# Patient Record
Sex: Male | Born: 1945
Health system: Southern US, Community
[De-identification: ages and names within clinical notes are randomized; demographics above are authoritative.]

## PROBLEM LIST (undated history)

## (undated) DIAGNOSIS — E785 Hyperlipidemia, unspecified: Secondary | ICD-10-CM

## (undated) DIAGNOSIS — I739 Peripheral vascular disease, unspecified: Secondary | ICD-10-CM

## (undated) DIAGNOSIS — N189 Chronic kidney disease, unspecified: Secondary | ICD-10-CM

## (undated) DIAGNOSIS — C4490 Unspecified malignant neoplasm of skin, unspecified: Secondary | ICD-10-CM

## (undated) DIAGNOSIS — I10 Essential (primary) hypertension: Secondary | ICD-10-CM

## (undated) DIAGNOSIS — I350 Nonrheumatic aortic (valve) stenosis: Secondary | ICD-10-CM

## (undated) DIAGNOSIS — D500 Iron deficiency anemia secondary to blood loss (chronic): Secondary | ICD-10-CM

## (undated) HISTORY — DX: Peripheral vascular disease, unspecified: I73.9

## (undated) HISTORY — DX: Essential (primary) hypertension: I10

## (undated) HISTORY — DX: Nonrheumatic aortic (valve) stenosis: I35.0

## (undated) HISTORY — DX: Iron deficiency anemia secondary to blood loss (chronic): D50.0

## (undated) HISTORY — DX: Chronic kidney disease, unspecified: N18.9

## (undated) HISTORY — PX: OTHER SURGICAL HISTORY: SHX169

## (undated) HISTORY — DX: Hyperlipidemia, unspecified: E78.5

## (undated) HISTORY — DX: Hypercalcemia: E83.52

## (undated) HISTORY — PX: STENT PLACEMENT VASCULAR (ARMC HX): HXRAD1737

---

## 1999-10-10 HISTORY — PX: CORONARY ARTERY BYPASS GRAFT: SHX141

## 2017-10-11 ENCOUNTER — Telehealth: Payer: Self-pay

## 2017-10-11 ENCOUNTER — Other Ambulatory Visit: Payer: Self-pay

## 2017-10-11 ENCOUNTER — Ambulatory Visit (HOSPITAL_BASED_OUTPATIENT_CLINIC_OR_DEPARTMENT_OTHER): Payer: Medicare Other | Admitting: Hematology & Oncology

## 2017-10-11 VITALS — BP 113/66 | HR 81 | Temp 97.3°F | Resp 20 | Wt 121.5 lb

## 2017-10-11 DIAGNOSIS — F17210 Nicotine dependence, cigarettes, uncomplicated: Secondary | ICD-10-CM | POA: Diagnosis not present

## 2017-10-11 DIAGNOSIS — C44310 Basal cell carcinoma of skin of unspecified parts of face: Secondary | ICD-10-CM

## 2017-10-11 DIAGNOSIS — R634 Abnormal weight loss: Secondary | ICD-10-CM

## 2017-10-11 DIAGNOSIS — R630 Anorexia: Secondary | ICD-10-CM

## 2017-10-11 DIAGNOSIS — D500 Iron deficiency anemia secondary to blood loss (chronic): Secondary | ICD-10-CM

## 2017-10-11 MED ORDER — MEGESTROL ACETATE 625 MG/5ML PO SUSP: 625.0000 mg | Freq: Every day | ORAL | 3 refills | Status: DC

## 2017-10-11 NOTE — Telephone Encounter (Signed)
Notes sent to scheduling.   

## 2017-10-11 NOTE — Progress Notes (Signed)
Referral MD  Reason for Referral: Locally advanced basal cell carcinoma of the left face  Chief Complaint  Patient presents with  . New Patient (Initial Visit)  : I just moved here from [place].  HPI: Jeremy Arnold is a very nice 72 year old white male.  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  He also was in the [employer].  He served in [place].  He smokes.  He probably has a 50-pack-year history of tobacco use.  He says that he had a basal cell cancer taken off the pinna of his left ear.  This was several years ago.  It sounds like he also had a basal cell cancer on the left side of his nose.  He did not decline any therapy for this.  He continued to progress.  It began to erode into his skin and bones on the left side of his nose.  It also began to invade into the left orbit.  He cannot open his left eye.  His ex-wife, who is a member of my church, talk to me about him.  She and her daughter went out to [place] to bring him back to [place].  She was wonder if we might be able to see him to try to help him.  He is lost about 25 pounds.  His appetite is not that great because of this tumor.  He just does not want to eat that much.  He says he gets full pretty quickly.  Surprisingly, he says that the tumor on the left side of his face is not painful.  He says his left side of the face is numb.  He is able to swallow.  He does have dentures.  He has to see a Theatre stage manager.  He has had no fever.  He has had no bleeding.  He has had no actual headache.  He really has only seen a cardiologist out in [place].  He is on some medications but not a lot.  He does not really drink.  Overall, I would say his performance status is ECOG 2.   No past medical history on file.:  :   Current Outpatient Medications:  .  aspirin 81 MG chewable tablet, Chew 81 mg by mouth daily., Disp: , Rfl:  .  atorvastatin (LIPITOR) 40 MG tablet, Take 40 mg by mouth daily.,  Disp: , Rfl:  .  clopidogrel (PLAVIX) 75 MG tablet, Take 75 mg by mouth daily., Disp: , Rfl:  .  hydrochlorothiazide (MICROZIDE) 12.5 MG capsule, Take 12.5 mg by mouth daily., Disp: , Rfl:  .  Metoprolol Tartrate 75 MG TABS, Take 75 mg by mouth 2 (two) times daily., Disp: , Rfl:  .  ramipril (ALTACE) 10 MG capsule, Take 10 mg by mouth 2 (two) times daily., Disp: , Rfl: :  :  Allergies  Allergen Reactions  . Shellfish Allergy Swelling  :  No family history on file.:  Social History   Socioeconomic History  . Marital status: Divorced    Spouse name: Not on file  . Number of children: Not on file  . Years of education: Not on file  . Highest education level: Not on file  Social Needs  . Financial resource strain: Not on file  . Food insecurity - worry: Not on file  . Food insecurity - inability: Not on file  . Transportation needs - medical: Not on file  . Transportation needs - non-medical: Not on file  Occupational  History  . Not on file  Tobacco Use  . Smoking status: Not on file  Substance and Sexual Activity  . Alcohol use: Not on file  . Drug use: Not on file  . Sexual activity: Not on file  Other Topics Concern  . Not on file  Social History Narrative  . Not on file  :  Pertinent items are noted in HPI.  Exam: Thin white male in no obvious distress.  His vital signs show a temperature of 97.3.  Pulse is 81.  Blood pressure 113/66.  Weight is 121 pounds.  Head neck exam shows obvious facial deformity on the left side of his face.  The left side of his nose is eroded away by tumor.  He cannot open the left eye.  He has tumor invasion into the orbital region.  There is no intraoral lesions.  The right side of his face looks okay.  There is no adenopathy in the neck.  Thyroid is nonpalpable.  Lungs are clear bilaterally.  Cardiac exam regular rate and rhythm with no murmurs, rubs or bruits.  Abdomen is soft.  He has good bowel sounds.  There is no fluid wave.  There is no  abdominal mass.  There is no palpable liver or spleen tip.  Back exam shows no tenderness over the spine, ribs or hips.  Extremities shows some muscle atrophy in upper and lower extremities.  Has good range of motion of his joints.  Neurological exam shows no obvious neurological deficits.  Skin exam shows some isolated basal cell cancers on his skin.  He has some ecchymoses that are scattered. @IPVITALS @   No results for input(s): WBC, HGB, HCT, PLT in the last 72 hours. No results for input(s): NA, K, CL, CO2, GLUCOSE, BUN, CREATININE, CALCIUM in the last 72 hours.  Blood smear review: None  Pathology:  None    Assessment and Plan: Jeremy Arnold is a nice 72 year old white male.  He has locally advanced basal cell carcinoma of the left side of his face.  He absolutely has had no treatment for this.  I am absolutely amazed that he actually is doing as well as he is doing.  I really believe that we can help him out.  This definitely will take a effort that is from several specialist.  I think we have to get an MRI of his face so we can see the extent of his disease.  I am not sure there is any surgery that can be done for this problem.  I think that radiation therapy can definitely help him out.  We might be able to get a response with radiation therapy and then possibly try systemic therapy.  The new treatment option is a Designer, television/film set (Vismodegib).  I really believe that we can make a difference.  I do not think we will be able to cure this but we can certainly help control it and try to improve his quality of life.  He is willing to try treatment.  I gave him a prayer blanket.  He has a very strong faith.  We need to get labs on him.  I will see about having him come in tomorrow for labs.  We will get the MRI.  I will try to get this early next week.  I spoke with Dr. Sondra Come of radiation oncology.  He will see Jeremy Arnold next week.  I spent about an hour with Jeremy Arnold and his  ex-wife.  Again, I  know her from church.  I will probably plan to get him back to be seen in another couple weeks.  We will try some Megace to help with his appetite.

## 2017-10-12 ENCOUNTER — Telehealth: Payer: Self-pay | Admitting: Hematology & Oncology

## 2017-10-12 ENCOUNTER — Other Ambulatory Visit: Payer: Self-pay | Admitting: *Deleted

## 2017-10-12 ENCOUNTER — Ambulatory Visit (HOSPITAL_BASED_OUTPATIENT_CLINIC_OR_DEPARTMENT_OTHER): Payer: Medicare Other

## 2017-10-12 ENCOUNTER — Encounter: Payer: Self-pay | Admitting: Hematology & Oncology

## 2017-10-12 DIAGNOSIS — C44310 Basal cell carcinoma of skin of unspecified parts of face: Secondary | ICD-10-CM | POA: Diagnosis present

## 2017-10-12 DIAGNOSIS — D500 Iron deficiency anemia secondary to blood loss (chronic): Secondary | ICD-10-CM

## 2017-10-12 HISTORY — DX: Hypercalcemia: E83.52

## 2017-10-12 HISTORY — DX: Iron deficiency anemia secondary to blood loss (chronic): D50.0

## 2017-10-12 LAB — CBC WITH DIFFERENTIAL (CANCER CENTER ONLY)
BASO#: 0 10e3/uL (ref 0.0–0.2)
BASO%: 0.6 % (ref 0.0–2.0)
EOS%: 1.9 % (ref 0.0–7.0)
Eosinophils Absolute: 0.1 10e3/uL (ref 0.0–0.5)
HCT: 28.8 % — ABNORMAL LOW (ref 38.7–49.9)
HGB: 9.3 g/dL — ABNORMAL LOW (ref 13.0–17.1)
LYMPH#: 2.2 10e3/uL (ref 0.9–3.3)
LYMPH%: 32.8 % (ref 14.0–48.0)
MCH: 28.2 pg (ref 28.0–33.4)
MCHC: 32.3 g/dL (ref 32.0–35.9)
MCV: 87 fL (ref 82–98)
MONO#: 0.8 10e3/uL (ref 0.1–0.9)
MONO%: 11.8 % (ref 0.0–13.0)
NEUT#: 3.6 10e3/uL (ref 1.5–6.5)
NEUT%: 52.9 % (ref 40.0–80.0)
Platelets: 179 10e3/uL (ref 145–400)
RBC: 3.3 10e6/uL — ABNORMAL LOW (ref 4.20–5.70)
RDW: 13.8 % (ref 11.1–15.7)
WBC: 6.7 10e3/uL (ref 4.0–10.0)

## 2017-10-12 LAB — CMP (CANCER CENTER ONLY)
ALT(SGPT): 24 U/L (ref 10–47)
AST: 24 U/L (ref 11–38)
Albumin: 3.3 g/dL (ref 3.3–5.5)
Alkaline Phosphatase: 90 U/L — ABNORMAL HIGH (ref 26–84)
BUN, Bld: 22 mg/dL (ref 7–22)
CO2: 30 meq/L (ref 18–33)
Calcium: 10.6 mg/dL — ABNORMAL HIGH (ref 8.0–10.3)
Chloride: 96 meq/L — ABNORMAL LOW (ref 98–108)
Creat: 1.7 mg/dL — ABNORMAL HIGH (ref 0.6–1.2)
Glucose, Bld: 109 mg/dL (ref 73–118)
Potassium: 5.6 meq/L — ABNORMAL HIGH (ref 3.3–4.7)
Sodium: 138 meq/L (ref 128–145)
Total Bilirubin: 0.5 mg/dL (ref 0.20–1.60)
Total Protein: 7.5 g/dL (ref 6.4–8.1)

## 2017-10-12 MED ORDER — MEGESTROL ACETATE 625 MG/5ML PO SUSP: 625.0000 mg | Freq: Every day | ORAL | 3 refills | Status: DC

## 2017-10-12 MED FILL — MEGESTROL 625 MG/5 ML SUSP: 625 | 30 days supply | Qty: 150 | Fill #0

## 2017-10-12 NOTE — Addendum Note (Signed)
Addended by: Burney Gauze R on: 10/12/2017 05:52 PM   Modules accepted: Orders

## 2017-10-12 NOTE — Telephone Encounter (Signed)
Pharmacy Oral Part D Info:  [employer] P: [phone] ID: [id number]

## 2017-10-13 LAB — IRON AND TIBC CHCC
IRON SATURATION: 6 % — AB (ref 15–55)
Iron: 22 ug/dL — ABNORMAL LOW (ref 38–169)
Total Iron Binding Capacity: 374 ug/dL (ref 250–450)
UIBC: 352 ug/dL — ABNORMAL HIGH (ref 111–343)

## 2017-10-13 LAB — FERRITIN CHCC: FERRITIN: 12 ng/mL — AB (ref 30–400)

## 2017-10-15 ENCOUNTER — Other Ambulatory Visit: Payer: Self-pay

## 2017-10-15 ENCOUNTER — Inpatient Hospital Stay: Payer: Medicare Other | Attending: Hematology & Oncology

## 2017-10-15 VITALS — BP 99/53 | HR 70 | Temp 97.5°F | Resp 18

## 2017-10-15 DIAGNOSIS — F419 Anxiety disorder, unspecified: Secondary | ICD-10-CM | POA: Insufficient documentation

## 2017-10-15 DIAGNOSIS — Z7982 Long term (current) use of aspirin: Secondary | ICD-10-CM | POA: Diagnosis not present

## 2017-10-15 DIAGNOSIS — R682 Dry mouth, unspecified: Secondary | ICD-10-CM | POA: Insufficient documentation

## 2017-10-15 DIAGNOSIS — Z7902 Long term (current) use of antithrombotics/antiplatelets: Secondary | ICD-10-CM | POA: Diagnosis not present

## 2017-10-15 DIAGNOSIS — H5789 Other specified disorders of eye and adnexa: Secondary | ICD-10-CM | POA: Insufficient documentation

## 2017-10-15 DIAGNOSIS — E860 Dehydration: Secondary | ICD-10-CM | POA: Diagnosis not present

## 2017-10-15 DIAGNOSIS — C44310 Basal cell carcinoma of skin of unspecified parts of face: Secondary | ICD-10-CM | POA: Insufficient documentation

## 2017-10-15 DIAGNOSIS — D500 Iron deficiency anemia secondary to blood loss (chronic): Secondary | ICD-10-CM

## 2017-10-15 DIAGNOSIS — R52 Pain, unspecified: Secondary | ICD-10-CM | POA: Insufficient documentation

## 2017-10-15 MED ORDER — DENOSUMAB 120 MG/1.7ML ~~LOC~~ SOLN
SUBCUTANEOUS | Status: AC
  Filled 2017-10-15: qty 1.7

## 2017-10-15 MED ORDER — SODIUM CHLORIDE 0.9 % IV SOLN
750.0000 mg | Freq: Once | INTRAVENOUS | Status: AC
  Administered 2017-10-15: 750 mg via INTRAVENOUS
  Filled 2017-10-15: qty 15

## 2017-10-15 MED ORDER — DENOSUMAB 120 MG/1.7ML ~~LOC~~ SOLN
120.0000 mg | Freq: Once | SUBCUTANEOUS | Status: AC
Start: 2017-10-15 — End: 2017-10-15
  Administered 2017-10-15: 120 mg via SUBCUTANEOUS

## 2017-10-15 MED ORDER — SODIUM CHLORIDE 0.9 % IV SOLN
1000.0000 mL | INTRAVENOUS | Status: DC
  Administered 2017-10-15: 14:00:00 via INTRAVENOUS

## 2017-10-15 NOTE — Patient Instructions (Addendum)
Dehydration, Adult Dehydration is a condition in which there is not enough fluid or water in the body. This happens when you lose more fluids than you take in. Important organs, such as the kidneys, brain, and heart, cannot function without a proper amount of fluids. Any loss of fluids from the body can lead to dehydration. Dehydration can range from mild to severe. This condition should be treated right away to prevent it from becoming severe. What are the causes? This condition may be caused by:  Vomiting.  Diarrhea.  Excessive sweating, such as from heat exposure or exercise.  Not drinking enough fluid, especially: ? When ill. ? While doing activity that requires a lot of energy.  Excessive urination.  Fever.  Infection.  Certain medicines, such as medicines that cause the body to lose excess fluid (diuretics).  Inability to access safe drinking water.  Reduced physical ability to get adequate water and food.  What increases the risk? This condition is more likely to develop in people:  Who have a poorly controlled long-term (chronic) illness, such as diabetes, heart disease, or kidney disease.  Who are age 35 or older.  Who are disabled.  Who live in a place with high altitude.  Who play endurance sports.  What are the signs or symptoms? Symptoms of mild dehydration may include:  Thirst.  Dry lips.  Slightly dry mouth.  Dry, warm skin.  Dizziness. Symptoms of moderate dehydration may include:  Very dry mouth.  Muscle cramps.  Dark urine. Urine may be the color of tea.  Decreased urine production.  Decreased tear production.  Heartbeat that is irregular or faster than normal (palpitations).  Headache.  Light-headedness, especially when you stand up from a sitting position.  Fainting (syncope). Symptoms of severe dehydration may include:  Changes in skin, such as: ? Cold and clammy skin. ? Blotchy (mottled) or pale skin. ? Skin that does  not quickly return to normal after being lightly pinched and released (poor skin turgor).  Changes in body fluids, such as: ? Extreme thirst. ? No tear production. ? Inability to sweat when body temperature is high, such as in hot weather. ? Very little urine production.  Changes in vital signs, such as: ? Weak pulse. ? Pulse that is more than 100 beats a minute when sitting still. ? Rapid breathing. ? Low blood pressure.  Other changes, such as: ? Sunken eyes. ? Cold hands and feet. ? Confusion. ? Lack of energy (lethargy). ? Difficulty waking up from sleep. ? Short-term weight loss. ? Unconsciousness. How is this diagnosed? This condition is diagnosed based on your symptoms and a physical exam. Blood and urine tests may be done to help confirm the diagnosis. How is this treated? Treatment for this condition depends on the severity. Mild or moderate dehydration can often be treated at home. Treatment should be started right away. Do not wait until dehydration becomes severe. Severe dehydration is an emergency and it needs to be treated in a hospital. Treatment for mild dehydration may include:  Drinking more fluids.  Replacing salts and minerals in your blood (electrolytes) that you may have lost. Treatment for moderate dehydration may include:  Drinking an oral rehydration solution (ORS). This is a drink that helps you replace fluids and electrolytes (rehydrate). It can be found at pharmacies and retail stores. Treatment for severe dehydration may include:  Receiving fluids through an IV tube.  Receiving an electrolyte solution through a feeding tube that is passed through your nose  and into your stomach (nasogastric tube, or NG tube).  Correcting any abnormalities in electrolytes.  Treating the underlying cause of dehydration. Follow these instructions at home:  If directed by your health care provider, drink an ORS: ? Make an ORS by following instructions on the  package. ? Start by drinking small amounts, about  cup (120 mL) every 5-10 minutes. ? Slowly increase how much you drink until you have taken the amount recommended by your health care provider.  Drink enough clear fluid to keep your urine clear or pale yellow. If you were told to drink an ORS, finish the ORS first, then start slowly drinking other clear fluids. Drink fluids such as: ? Water. Do not drink only water. Doing that can lead to having too little salt (sodium) in the body (hyponatremia). ? Ice chips. ? Fruit juice that you have added water to (diluted fruit juice). ? Low-calorie sports drinks.  Avoid: ? Alcohol. ? Drinks that contain a lot of sugar. These include high-calorie sports drinks, fruit juice that is not diluted, and soda. ? Caffeine. ? Foods that are greasy or contain a lot of fat or sugar.  Take over-the-counter and prescription medicines only as told by your health care provider.  Do not take sodium tablets. This can lead to having too much sodium in the body (hypernatremia).  Eat foods that contain a healthy balance of electrolytes, such as bananas, oranges, potatoes, tomatoes, and spinach.  Keep all follow-up visits as told by your health care provider. This is important. Contact a health care provider if:  You have abdominal pain that: ? Gets worse. ? Stays in one area (localizes).  You have a rash.  You have a stiff neck.  You are more irritable than usual.  You are sleepier or more difficult to wake up than usual.  You feel weak or dizzy.  You feel very thirsty.  You have urinated only a small amount of very dark urine over 6-8 hours. Get help right away if:  You have symptoms of severe dehydration.  You cannot drink fluids without vomiting.  Your symptoms get worse with treatment.  You have a fever.  You have a severe headache.  You have vomiting or diarrhea that: ? Gets worse. ? Does not go away.  You have blood or green matter  (bile) in your vomit.  You have blood in your stool. This may cause stool to look black and tarry.  You have not urinated in 6-8 hours.  You faint.  Your heart rate while sitting still is over 100 beats a minute.  You have trouble breathing. This information is not intended to replace advice given to you by your health care provider. Make sure you discuss any questions you have with your health care provider. Document Released: 09/25/2005 Document Revised: 04/21/2016 Document Reviewed: 11/19/2015 Elsevier Interactive Patient Education  2018 [employer].  Denosumab injection Delton See) What is this medicine? DENOSUMAB (den oh sue mab) slows bone breakdown. Prolia is used to treat osteoporosis in women after menopause and in men. Delton See is used to treat a high calcium level due to cancer and to prevent bone fractures and other bone problems caused by multiple myeloma or cancer bone metastases. Delton See is also used to treat giant cell tumor of the bone. This medicine may be used for other purposes; ask your health care provider or pharmacist if you have questions. COMMON BRAND NAME(S): Prolia, XGEVA What should I tell my health care provider before I take  this medicine? They need to know if you have any of these conditions: -dental disease -having surgery or tooth extraction -infection -kidney disease -low levels of calcium or Vitamin D in the blood -malnutrition -on hemodialysis -skin conditions or sensitivity -thyroid or parathyroid disease -an unusual reaction to denosumab, other medicines, foods, dyes, or preservatives -pregnant or trying to get pregnant -breast-feeding How should I use this medicine? This medicine is for injection under the skin. It is given by a health care professional in a hospital or clinic setting. If you are getting Prolia, a special MedGuide will be given to you by the pharmacist with each prescription and refill. Be sure to read this information carefully  each time. For Prolia, talk to your pediatrician regarding the use of this medicine in children. Special care may be needed. For Delton See, talk to your pediatrician regarding the use of this medicine in children. While this drug may be prescribed for children as young as 13 years for selected conditions, precautions do apply. Overdosage: If you think you have taken too much of this medicine contact a poison control center or emergency room at once. NOTE: This medicine is only for you. Do not share this medicine with others. What if I miss a dose? It is important not to miss your dose. Call your doctor or health care professional if you are unable to keep an appointment. What may interact with this medicine? Do not take this medicine with any of the following medications: -other medicines containing denosumab This medicine may also interact with the following medications: -medicines that lower your chance of fighting infection -steroid medicines like prednisone or cortisone This list may not describe all possible interactions. Give your health care provider a list of all the medicines, herbs, non-prescription drugs, or dietary supplements you use. Also tell them if you smoke, drink alcohol, or use illegal drugs. Some items may interact with your medicine. What should I watch for while using this medicine? Visit your doctor or health care professional for regular checks on your progress. Your doctor or health care professional may order blood tests and other tests to see how you are doing. Call your doctor or health care professional for advice if you get a fever, chills or sore throat, or other symptoms of a cold or flu. Do not treat yourself. This drug may decrease your body's ability to fight infection. Try to avoid being around people who are sick. You should make sure you get enough calcium and vitamin D while you are taking this medicine, unless your doctor tells you not to. Discuss the foods you  eat and the vitamins you take with your health care professional. See your dentist regularly. Brush and floss your teeth as directed. Before you have any dental work done, tell your dentist you are receiving this medicine. Do not become pregnant while taking this medicine or for 5 months after stopping it. Talk with your doctor or health care professional about your birth control options while taking this medicine. Women should inform their doctor if they wish to become pregnant or think they might be pregnant. There is a potential for serious side effects to an unborn child. Talk to your health care professional or pharmacist for more information. What side effects may I notice from receiving this medicine? Side effects that you should report to your doctor or health care professional as soon as possible: -allergic reactions like skin rash, itching or hives, swelling of the face, lips, or tongue -bone pain -  breathing problems -dizziness -jaw pain, especially after dental work -redness, blistering, peeling of the skin -signs and symptoms of infection like fever or chills; cough; sore throat; pain or trouble passing urine -signs of low calcium like fast heartbeat, muscle cramps or muscle pain; pain, tingling, numbness in the hands or feet; seizures -unusual bleeding or bruising -unusually weak or tired Side effects that usually do not require medical attention (report to your doctor or health care professional if they continue or are bothersome): -constipation -diarrhea -headache -joint pain -loss of appetite -muscle pain -runny nose -tiredness -upset stomach This list may not describe all possible side effects. Call your doctor for medical advice about side effects. You may report side effects to FDA at 1-800-FDA-1088. Where should I keep my medicine? This medicine is only given in a clinic, doctor's office, or other health care setting and will not be stored at home. NOTE: This sheet is  a summary. It may not cover all possible information. If you have questions about this medicine, talk to your doctor, pharmacist, or health care provider.  2018 Elsevier/Gold Standard (2016-10-17 19:17:21)  Ferric carboxymaltose injection (Injectafer) What is this medicine? FERRIC CARBOXYMALTOSE (ferr-ik car-box-ee-mol-toes) is an iron complex. Iron is used to make healthy red blood cells, which carry oxygen and nutrients throughout the body. This medicine is used to treat anemia in people with chronic kidney disease or people who cannot take iron by mouth. This medicine may be used for other purposes; ask your health care provider or pharmacist if you have questions. COMMON BRAND NAME(S): Injectafer What should I tell my health care provider before I take this medicine? They need to know if you have any of these conditions: -anemia not caused by low iron levels -high levels of iron in the blood -liver disease -an unusual or allergic reaction to iron, other medicines, foods, dyes, or preservatives -pregnant or trying to get pregnant -breast-feeding How should I use this medicine? This medicine is for infusion into a vein. It is given by a health care professional in a hospital or clinic setting. Talk to your pediatrician regarding the use of this medicine in children. Special care may be needed. Overdosage: If you think you have taken too much of this medicine contact a poison control center or emergency room at once. NOTE: This medicine is only for you. Do not share this medicine with others. What if I miss a dose? It is important not to miss your dose. Call your doctor or health care professional if you are unable to keep an appointment. What may interact with this medicine? Do not take this medicine with any of the following medications: -deferoxamine -dimercaprol -other iron products This medicine may also interact with the following medications: -chloramphenicol -deferasirox This  list may not describe all possible interactions. Give your health care provider a list of all the medicines, herbs, non-prescription drugs, or dietary supplements you use. Also tell them if you smoke, drink alcohol, or use illegal drugs. Some items may interact with your medicine. What should I watch for while using this medicine? Visit your doctor or health care professional regularly. Tell your doctor if your symptoms do not start to get better or if they get worse. You may need blood work done while you are taking this medicine. You may need to follow a special diet. Talk to your doctor. Foods that contain iron include: whole grains/cereals, dried fruits, beans, or peas, leafy green vegetables, and organ meats (liver, kidney). What side effects may  I notice from receiving this medicine? Side effects that you should report to your doctor or health care professional as soon as possible: -allergic reactions like skin rash, itching or hives, swelling of the face, lips, or tongue -breathing problems -changes in blood pressure -feeling faint or lightheaded, falls -flushing, sweating, or hot feelings Side effects that usually do not require medical attention (report to your doctor or health care professional if they continue or are bothersome): -changes in taste -constipation -dizziness -headache -nausea -pain, redness, or irritation at site where injected -vomiting This list may not describe all possible side effects. Call your doctor for medical advice about side effects. You may report side effects to FDA at 1-800-FDA-1088. Where should I keep my medicine? This drug is given in a hospital or clinic and will not be stored at home. NOTE: This sheet is a summary. It may not cover all possible information. If you have questions about this medicine, talk to your doctor, pharmacist, or health care provider.  2018 Elsevier/Gold Standard (2015-10-28 11:20:47)

## 2017-10-15 NOTE — Progress Notes (Signed)
Ok to administer xgeva per Otilio Carpen.   Ok to discharge with low blood pressure per Laverna Peace, NP. Patient asymptomatic, patient denies fatigue, weakness or lightheadedness.

## 2017-10-17 ENCOUNTER — Ambulatory Visit: Payer: Medicare Other | Admitting: Hematology & Oncology

## 2017-10-18 ENCOUNTER — Ambulatory Visit
Admission: RE | Admit: 2017-10-18 | Discharge: 2017-10-18 | Disposition: A | Discharge status: Home / Self Care | Payer: Medicare Other | Source: Ambulatory Visit | Attending: Radiation Oncology | Admitting: Radiation Oncology

## 2017-10-18 ENCOUNTER — Ambulatory Visit: Payer: Medicare Other

## 2017-10-18 ENCOUNTER — Telehealth: Payer: Self-pay | Admitting: Oncology

## 2017-10-18 DIAGNOSIS — R220 Localized swelling, mass and lump, head: Secondary | ICD-10-CM | POA: Insufficient documentation

## 2017-10-18 DIAGNOSIS — R262 Difficulty in walking, not elsewhere classified: Secondary | ICD-10-CM | POA: Insufficient documentation

## 2017-10-18 DIAGNOSIS — F419 Anxiety disorder, unspecified: Secondary | ICD-10-CM | POA: Insufficient documentation

## 2017-10-18 DIAGNOSIS — R251 Tremor, unspecified: Secondary | ICD-10-CM | POA: Insufficient documentation

## 2017-10-18 DIAGNOSIS — F17210 Nicotine dependence, cigarettes, uncomplicated: Secondary | ICD-10-CM | POA: Insufficient documentation

## 2017-10-18 DIAGNOSIS — D500 Iron deficiency anemia secondary to blood loss (chronic): Secondary | ICD-10-CM | POA: Insufficient documentation

## 2017-10-18 DIAGNOSIS — Z7982 Long term (current) use of aspirin: Secondary | ICD-10-CM | POA: Insufficient documentation

## 2017-10-18 DIAGNOSIS — Z79899 Other long term (current) drug therapy: Secondary | ICD-10-CM | POA: Insufficient documentation

## 2017-10-18 DIAGNOSIS — H05402 Unspecified enophthalmos, left eye: Secondary | ICD-10-CM | POA: Insufficient documentation

## 2017-10-18 DIAGNOSIS — R531 Weakness: Secondary | ICD-10-CM | POA: Insufficient documentation

## 2017-10-18 DIAGNOSIS — Z85828 Personal history of other malignant neoplasm of skin: Secondary | ICD-10-CM | POA: Insufficient documentation

## 2017-10-18 DIAGNOSIS — M899 Disorder of bone, unspecified: Secondary | ICD-10-CM | POA: Insufficient documentation

## 2017-10-18 NOTE — Telephone Encounter (Signed)
Left message for patient regarding his appointment today.  Requested a return call.

## 2017-10-18 NOTE — Telephone Encounter (Signed)
Patient's emergency contact, Delcie Roch, called back and said they were not notified of the appointment today.  She would like to reschedule the appointment for the week of 1/21.  Appointment given for 1/23 at 9 am.  She verbalized agreement.

## 2017-10-19 ENCOUNTER — Ambulatory Visit: Payer: Medicare Other | Admitting: Family Medicine

## 2017-10-21 ENCOUNTER — Other Ambulatory Visit: Payer: Self-pay

## 2017-10-21 ENCOUNTER — Encounter (HOSPITAL_COMMUNITY): Payer: Self-pay | Admitting: Emergency Medicine

## 2017-10-21 ENCOUNTER — Emergency Department (HOSPITAL_COMMUNITY)
Admission: EM | Admit: 2017-10-21 | Discharge: 2017-10-22 | Disposition: A | Discharge status: Home / Self Care | Payer: Medicare Other | Attending: Emergency Medicine | Admitting: Emergency Medicine

## 2017-10-21 DIAGNOSIS — R040 Epistaxis: Secondary | ICD-10-CM | POA: Diagnosis not present

## 2017-10-21 DIAGNOSIS — Z7982 Long term (current) use of aspirin: Secondary | ICD-10-CM | POA: Insufficient documentation

## 2017-10-21 DIAGNOSIS — Z79899 Other long term (current) drug therapy: Secondary | ICD-10-CM | POA: Insufficient documentation

## 2017-10-21 HISTORY — DX: Unspecified malignant neoplasm of skin, unspecified: C44.90

## 2017-10-21 MED ORDER — TRANEXAMIC ACID 1000 MG/10ML IV SOLN
500.0000 mg | Freq: Once | INTRAVENOUS | Status: AC
  Administered 2017-10-22: 500 mg via TOPICAL
  Filled 2017-10-21: qty 10

## 2017-10-21 NOTE — ED Provider Notes (Signed)
[facility] DEPT Provider Note   CSN: [id number] Arrival date & time: 10/21/17  2211  Time seen 23:10 PM    History   Chief Complaint Chief Complaint  Patient presents with  . facial bleeding    HPI Jeremy Arnold is a 72 y.o. male.  HPI patient has basal cell carcinoma of the left side of his nose that has been untreated, it has invaded to the nasal septum and up into the left eye.  He was seen for the first time by Dr. Marin Olp on January 3.  Patient states he was anemic and he got a iron transfusion.  He also had hypercalcemia and was given IV fluids.  His blood pressure was noted to be low and they stopped his ramipril.  He states tonight about 30 minutes prior to arrival he blew his nose and he started having bleeding from the left side.  The bleeding lasted about 20 minutes.  It is not bleeding now.  They state it was not hemorrhaging however if he did not hold pressure on it the blood would trickle down his face.  Patient is on Plavix because he has stents in both legs proximally.  He also states he had bypass surgery in 2011, he does not have stents in his cardiac vessels.  He also takes a baby aspirin a day.  PCP  Oncology Volanda Napoleon, MD   Past Medical History:  Diagnosis Date  . Iron deficiency anemia due to chronic blood loss 10/12/2017  . 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 Skin cancer     Patient Active Problem List   Diagnosis Date Noted  . 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 Iron deficiency anemia due to chronic blood loss 10/12/2017    History reviewed. No pertinent surgical history.     Home Medications    Prior to Admission medications   Medication Sig Start Date End Date Taking? Authorizing Provider  aspirin 81 MG chewable tablet Chew 81 mg by mouth daily.   Yes [provider]  atorvastatin (LIPITOR) 40 MG tablet Take 40 mg by mouth daily.   Yes [provider]  clopidogrel  (PLAVIX) 75 MG tablet Take 75 mg by mouth daily.   Yes [provider]  megestrol (MEGACE ES) 625 MG/5ML suspension Take 5 mLs (625 mg total) by mouth daily. 10/12/17  Yes Volanda Napoleon, MD  metoprolol tartrate (LOPRESSOR) 25 MG tablet Take 25 mg by mouth 2 (two) times daily. Take with 50mg  tablet for a total dose of 75mg  twice a day   Yes [provider]  metoprolol tartrate (LOPRESSOR) 50 MG tablet Take 50 mg by mouth 2 (two) times daily. Take with 25mg  tablet for a total dose of 75mg  twice a day   Yes [provider]  Multiple Vitamin (MULTIVITAMIN WITH MINERALS) TABS tablet Take 1 tablet by mouth daily.   Yes [provider]    Family History History reviewed. No pertinent family history.  Social History Social History   Tobacco Use  . Smoking status: Never Smoker  . Smokeless tobacco: Never Used  Substance Use Topics  . Alcohol use: No    Frequency: Never  . Drug use: No  recently moved from [place] allergy   Review of Systems Review of Systems  All other systems reviewed and are negative.    Physical Exam Updated Vital Signs BP 128/60 (BP Location: Left Arm)   Pulse 90  Temp 98.3 F (36.8 C) (Oral)   Resp 16   SpO2 100%   Vital signs normal    Physical Exam  Constitutional: He is oriented to person, place, and time.  Thin pleasant cooperative male  HENT:  Head: Normocephalic.  Right Ear: External ear normal.  Left Ear: External ear normal.  Pt has extensive loss of soft tissue of his left nose, the nasal septum is exposed and visible with some dried blood that seems to start high up on the septum. Most of the left nostril is gone. No gross bleeding now. He has some excess soft tissue masses between the nose and the left eye c/w tumor involvement.   Eyes:  Unable to see his left globe but he appears to have tissue loss behind his eyelids compared to the right.   Neck: Normal range of motion.  Neck supple.  Cardiovascular: Normal rate.  Pulmonary/Chest: Effort normal. No respiratory distress.  Neurological: He is alert and oriented to person, place, and time.  Skin: Skin is warm and dry.  Psychiatric: He has a normal mood and affect. His behavior is normal. Thought content normal.  Nursing note and vitals reviewed.    ED Treatments / Results  Labs (all labs ordered are listed, but only abnormal results are displayed) Labs Reviewed - No data to display  EKG  EKG Interpretation None       Radiology No results found.  Procedures Procedures (including critical care time)  Medications Ordered in ED Medications  tranexamic acid (CYKLOKAPRON) injection 500 mg (500 mg Topical Given by Other 10/22/17 0016)     Initial Impression / Assessment and Plan / ED Course  I have reviewed the triage vital signs and the nursing notes.  Pertinent labs & imaging results that were available during my care of the patient were reviewed by me and considered in my medical decision making (see chart for details).     Patient does not want blood work to be done today.  I did review his blood work from the fourth and he was noted to be anemic with a hemoglobin around 9 and hypercalcemic with calcium around 10.5.  His iron indices were low which explains why they gave him an iron transfusion.  He also was started on Megace for loss of appetite with a 40 pound weight loss per family.  Surgicel was moistened with TXA to keep in on the nasal septum for control of bleeding.   Final Clinical Impressions(s) / ED Diagnoses   Final diagnoses:  Bleeding nose    ED Discharge Orders    None      Plan discharge  Rolland Porter, MD, Barbette Or, MD 10/22/17 [phone]

## 2017-10-21 NOTE — ED Triage Notes (Signed)
Pt has skin cancer on his face and about 45 minutes ago he went to blow his nose and the left side of his nose started bleeding   Pt is on blood thinners and he is on a baby aspirin regimen  Pt sees Dr Levin Bacon at the [facility]

## 2017-10-22 ENCOUNTER — Other Ambulatory Visit: Payer: Self-pay | Admitting: Family

## 2017-10-22 ENCOUNTER — Ambulatory Visit
Admission: RE | Admit: 2017-10-22 | Discharge: 2017-10-22 | Disposition: A | Discharge status: Home / Self Care | Payer: Medicare Other | Source: Ambulatory Visit | Attending: Hematology & Oncology | Admitting: Hematology & Oncology

## 2017-10-22 DIAGNOSIS — C44310 Basal cell carcinoma of skin of unspecified parts of face: Secondary | ICD-10-CM

## 2017-10-22 DIAGNOSIS — D500 Iron deficiency anemia secondary to blood loss (chronic): Secondary | ICD-10-CM

## 2017-10-22 DIAGNOSIS — R040 Epistaxis: Secondary | ICD-10-CM | POA: Diagnosis not present

## 2017-10-22 IMAGING — MR MR FACE/TRIGEMINAL WO/W CM
6 of 10 series · 20 of 48 positions shown · IV contrast (10ml multihance)
Comparison: None.

CLINICAL DATA: 71-year-old male with basal cell skin cancer, with
suspected bone involvement. Study for treatment planning.

Patient reports visual problems, difficulty walking, and bilateral
weakness.
EXAM:
MRI FACE TRIGEMINAL WITHOUT AND WITH CONTRAST
TECHNIQUE: Multiplanar, multiecho pulse sequences of the face and surrounding
structures, including thin slice imaging of the course of the
Trigeminal Nerves, were obtained both before and after
administration of intravenous contrast.
CONTRAST:  10mL MULTIHANCE GADOBENATE DIMEGLUMINE 529 MG/ML IV SOLN

[Series 2: T1 · sagittal · 3.0mm · 0.35mm/px · 1 of 14 slices shown (1 of 4)]
[im 1/14]
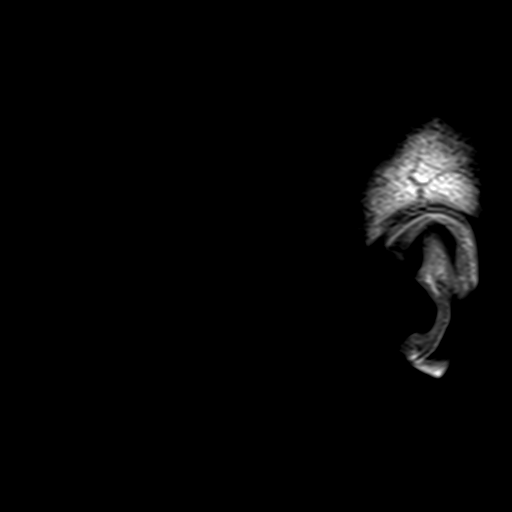

[Series 3: T1 · coronal · 3.0mm · 0.35mm/px · 3 of 45 slices shown (2 of 4)]
[im 1/45]
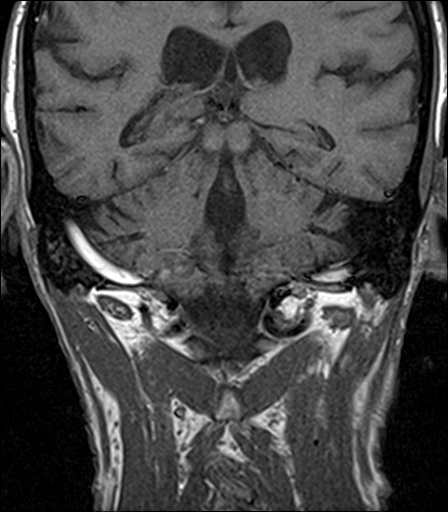
[im 23/45]
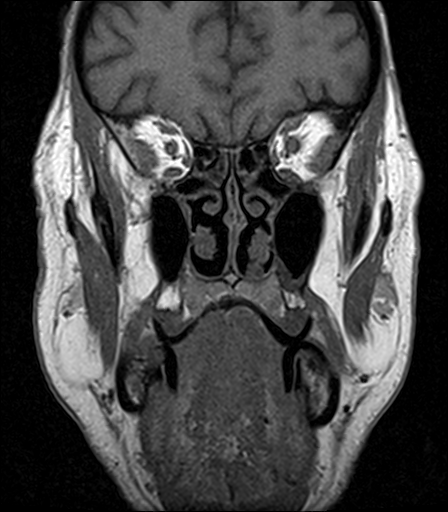
[im 45/45]
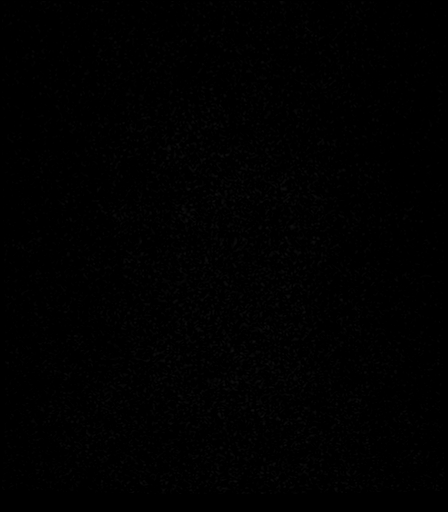

[Series 4: T1 · sagittal · 3.0mm · 0.35mm/px · 4 of 39 slices shown (3 of 4)]
[im 1/39]
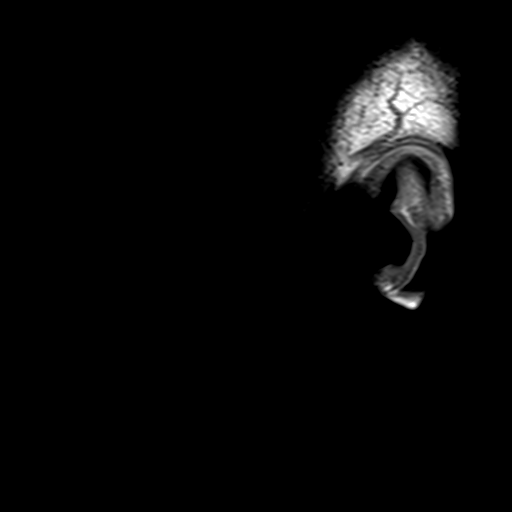
[im 13/39]
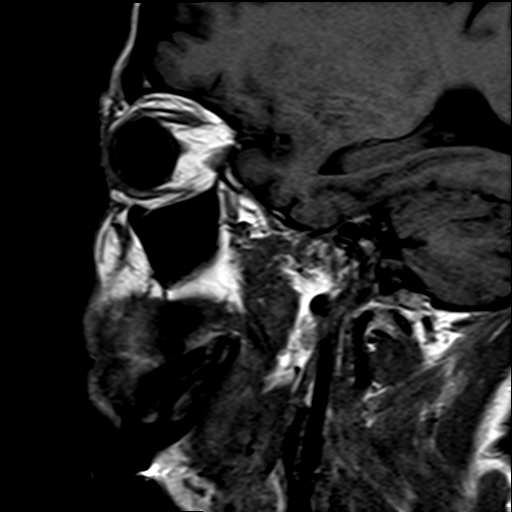
[im 26/39]
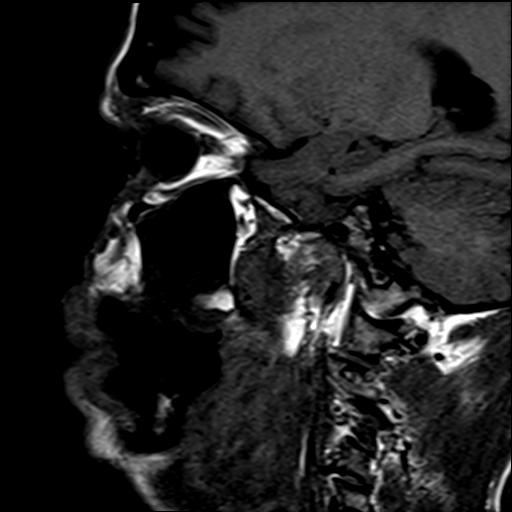
[im 39/39]
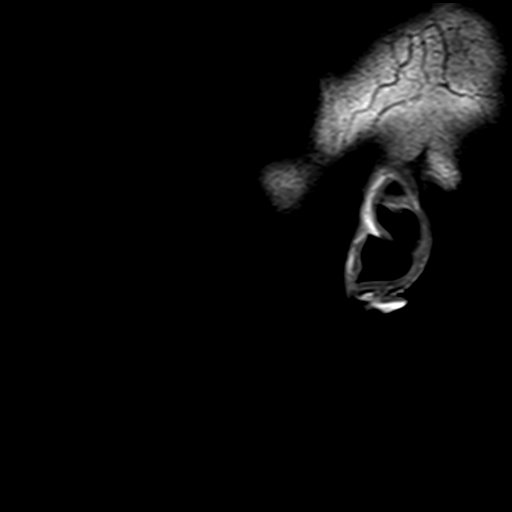

[Series 5: T2 · coronal · 3.0mm · 0.35mm/px · 5 of 48 slices shown]
[im 1/48]
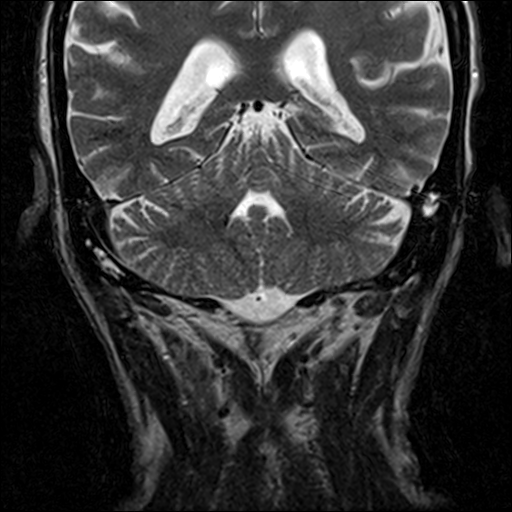
[im 12/48]
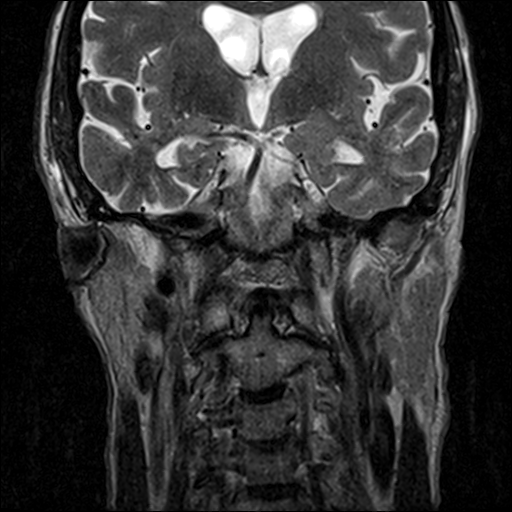
[im 24/48]
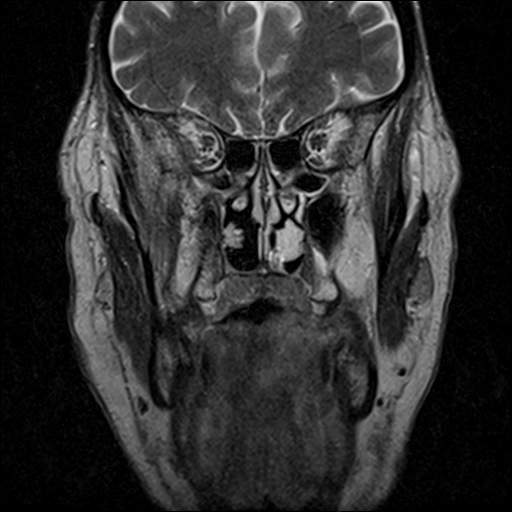
[im 36/48]
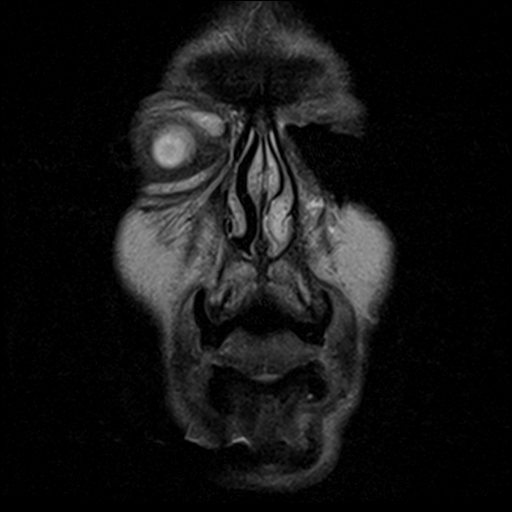
[im 48/48]
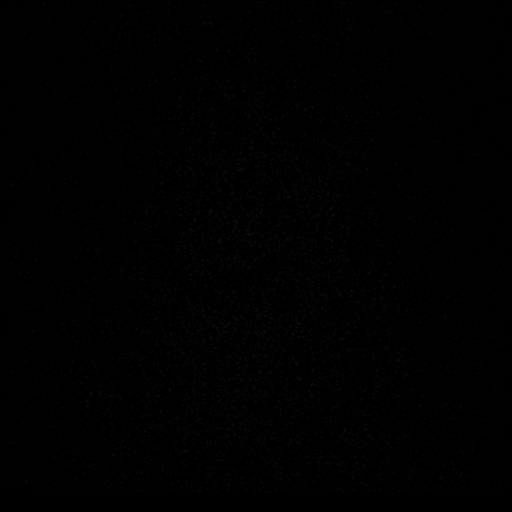

[Series 7: T1 · axial · 3.0mm · 0.50mm/px · z∈[-160,+18]mm · 5 of 55 slices shown (4 of 4)]
[im 1/55]
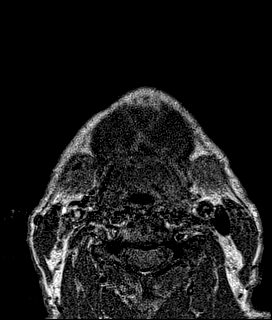
[im 14/55]
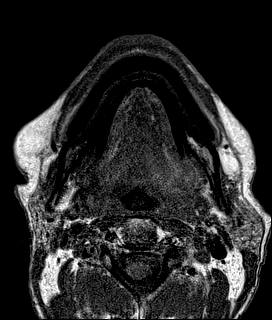
[im 28/55]
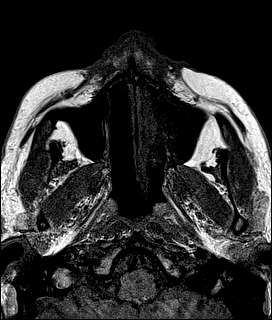
[im 41/55]
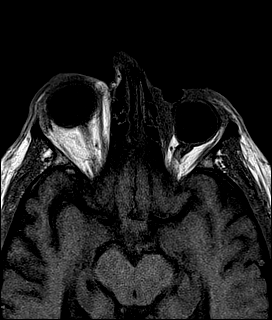
[im 55/55]
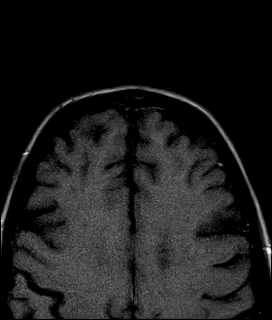

[Series 10: T1 fat-sat · coronal · 3.0mm · 0.35mm/px · 2 of 48 slices shown]
[im 1/48]
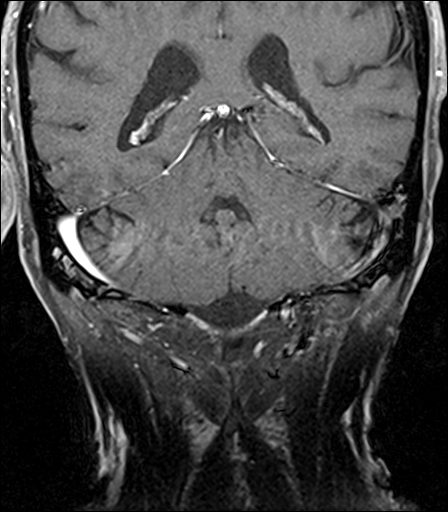
[im 12/48]
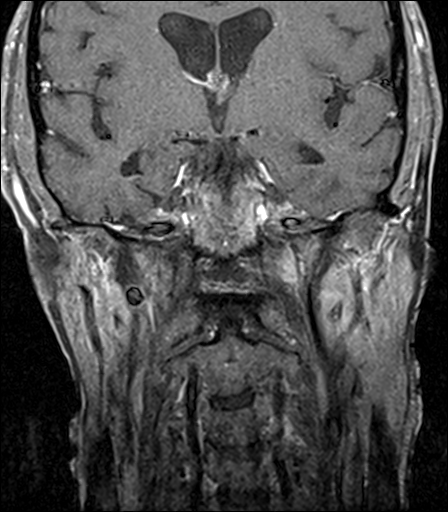

[20 of 48 positions shown; findings below may reference images not displayed]

FINDINGS: Major intracranial vascular flow voids are preserved. The brainstem
and visible brain parenchyma appears within normal limits for age.
No dural thickening along the left anterior cranial fossa superior
to the abnormal left orbit (see below).

Normal bilateral cisternal 5th nerve segments. Normal bilateral
Meckel's cave. Bilateral cavernous sinus is within normal limits.

The bilateral V2 trunks appear symmetric and within normal limits
(series 5, image 19 and series 10, image 20). The bilateral V3
trunks just caudal to the skull base appear symmetric and within
normal limits (series 11, image 19). Likewise, the bilateral
infraorbital and bilateral inferior alveolar nerves appear symmetric
and within normal limits.

Postoperative changes to the right globe. The right orbit soft
tissues appear normal.

However, there is left enophthalmos, distortion of the left globe
contour, and multifocal enhancing soft tissue masses within the left
orbit.

Along the anterior portion of the left globe a confluent circular or
comma-shaped soft tissue mass is demonstrated (series 3, image 32)
up to 3.4 centimeters diameter/CC. Note the contiguous
hyperenhancement (series 10, image 34) which is contiguous with the
skin surface, and with what appears to be surgical defect along the
left preseptal space (series 6, image 35). There is associated
distortion of the left globe shaped, as well as asymmetric
thickening and enhancement of the left retina and/or choroid. See
series 11, images 29 and 30. The left lens appears spared.

The soft tissue mass then tracks posteriorly into the postseptal
compartment of the left orbit, with contiguous components in both
the medial and lateral left orbit. Medially a confluent mass is
inseparable from the superior rectus, medial rectus, and left
superior oblique muscles (series 3, image 27). Laterally both the
intraconal and extraconal space are affected with mass inseparable
from the lateral rectus muscle (series 3, image 28). There is also
more curvilinear, less mass like enhancement along the margins of
the left optic nerve posterior to the left globe as seen on series
11, image 30. However, no abnormal enhancement within the substance
of the left optic nerve. The left orbital apex appears normal.

There also a masslike soft tissue thickening affecting the residual
superficial skin and dermis of the anterior nose (more so on the
right as a portion of the left nose has been resected) and tracking
toward the top lip. There is associated involvement of the medial
left premalar soft tissues encompassing an area of 2-2.5 centimeters
largest dimension as seen on series 11, image 20. There could be
involvement of the anterior cartilaginous nasal septum.

Ethmoid and left maxillary sinus mucosal thickening, as well as
mucosal thickening and enhancement in the left nasal cavity with
retained secretions appears to be benign and inflammatory. However,
focal extension across the left lamina papyracea and into some of
the left ethmoids is difficult to exclude (e.g. Series 10, image
30). The right side sinuses are clear.

Negative visible pharynx, parapharyngeal spaces, retropharyngeal
space, sublingual space, submandibular spaces, and parotid spaces.
No upper cervical lymphadenopathy identified.

No definite abnormal bone marrow signal in the left face or orbit.
Visible skull base and cervical spine marrow signal is within normal
limits. Negative visible spinal cord.
IMPRESSION: 1. Confluent and multifocal soft tissue masses in both the preseptal
and postseptal spaces of the left orbit with associated
enophthalmos, involvement of multiple anterior extraocular muscles,
and involvement and deformity of the left globe. Patchy involvement
along the margins of the anterior left optic nerve, but no tumor is
suspected within the substance of the left optic nerve.
2. No evidence of extension to the left orbital apex or into the
cavernous sinus. No bilateral V2, V3, or main trigeminal nerve
involvement. No other intracranial extension; no associated dural
thickening or intracranial extension along the floor of the left
anterior cranial fossa.
3. Tumor along the residual bilateral nose and a 2 cm area of
involvement at the medial left premalar soft tissues adjacent to
prior left nasal resection site. Tumor extension into the anterior
cartilaginous nasal septum and across the left lamina papyracea into
some of the left ethmoid air cells cannot be excluded.

## 2017-10-22 MED ORDER — GADOBENATE DIMEGLUMINE 529 MG/ML IV SOLN
10.0000 mL | Freq: Once | INTRAVENOUS | Status: AC | PRN
  Administered 2017-10-22: 10 mL via INTRAVENOUS

## 2017-10-22 NOTE — Discharge Instructions (Signed)
Try to keep the surgicel (cloth like material) over the bleeding area. Return to the ED if you have heavy bleeding. Otherwise, keep your appointments as arranged by Dr Marin Olp.

## 2017-10-23 ENCOUNTER — Other Ambulatory Visit: Payer: Self-pay

## 2017-10-23 ENCOUNTER — Inpatient Hospital Stay: Payer: Medicare Other

## 2017-10-23 ENCOUNTER — Inpatient Hospital Stay (HOSPITAL_BASED_OUTPATIENT_CLINIC_OR_DEPARTMENT_OTHER): Payer: Medicare Other | Admitting: Family

## 2017-10-23 VITALS — BP 120/60 | HR 84 | Temp 98.1°F | Resp 20 | Wt 129.5 lb

## 2017-10-23 DIAGNOSIS — C44310 Basal cell carcinoma of skin of unspecified parts of face: Secondary | ICD-10-CM | POA: Diagnosis not present

## 2017-10-23 DIAGNOSIS — D500 Iron deficiency anemia secondary to blood loss (chronic): Secondary | ICD-10-CM

## 2017-10-23 DIAGNOSIS — R682 Dry mouth, unspecified: Secondary | ICD-10-CM

## 2017-10-23 DIAGNOSIS — F418 Other specified anxiety disorders: Secondary | ICD-10-CM

## 2017-10-23 DIAGNOSIS — G4700 Insomnia, unspecified: Secondary | ICD-10-CM | POA: Diagnosis not present

## 2017-10-23 LAB — IRON AND TIBC
Iron: 70 ug/dL (ref 42–163)
SATURATION RATIOS: 24 % — AB (ref 42–163)
TIBC: 291 ug/dL (ref 202–409)
UIBC: 222 ug/dL

## 2017-10-23 LAB — CMP (CANCER CENTER ONLY)
ALBUMIN: 3.1 g/dL — AB (ref 3.5–5.0)
ALT: 22 U/L (ref 0–55)
AST: 20 U/L (ref 5–34)
Alkaline Phosphatase: 66 U/L (ref 40–150)
Anion gap: 7 (ref 3–11)
BUN: 11 mg/dL (ref 7–26)
CHLORIDE: 104 mmol/L (ref 98–109)
CO2: 21 mmol/L — ABNORMAL LOW (ref 22–29)
CREATININE: 1.29 mg/dL (ref 0.70–1.30)
Calcium: 7.8 mg/dL — ABNORMAL LOW (ref 8.4–10.4)
GFR, Est AFR Am: >60 mL/min (ref >60)
GFR, Estimated: 54 mL/min — ABNORMAL LOW (ref >60)
Glucose, Bld: 87 mg/dL (ref 70–140)
POTASSIUM: 4.7 mmol/L (ref 3.5–5.1)
Sodium: 132 mmol/L — ABNORMAL LOW (ref 136–145)
Total Bilirubin: 0.3 mg/dL (ref 0.2–1.2)
Total Protein: 6.4 g/dL (ref 6.4–8.3)

## 2017-10-23 LAB — CBC WITH DIFFERENTIAL (CANCER CENTER ONLY)
BASOS ABS: 0 10*3/uL (ref 0.0–0.1)
Basophils Relative: 0 %
EOS ABS: 0.1 10*3/uL (ref 0.0–0.5)
EOS PCT: 2 %
HCT: 26.9 % — ABNORMAL LOW (ref 38.7–49.9)
Hemoglobin: 8.5 g/dL — ABNORMAL LOW (ref 13.0–17.1)
LYMPHS PCT: 23 %
Lymphs Abs: 2.1 10*3/uL (ref 0.9–3.3)
MCH: 28.8 pg (ref 28.0–33.4)
MCHC: 31.6 g/dL — ABNORMAL LOW (ref 32.0–35.9)
MCV: 91.2 fL (ref 82.0–98.0)
MONO ABS: 0.9 10*3/uL (ref 0.1–0.9)
Monocytes Relative: 10 %
Neutro Abs: 5.8 10*3/uL (ref 1.5–6.5)
Neutrophils Relative %: 65 %
PLATELETS: 220 10*3/uL (ref 140–400)
RBC: 2.95 MIL/uL — AB (ref 4.20–5.70)
RDW: 17.4 % — AB (ref 11.1–15.7)
WBC: 8.9 10*3/uL (ref 4.0–10.3)

## 2017-10-23 LAB — FERRITIN: Ferritin: 583 ng/mL — ABNORMAL HIGH (ref 22–316)

## 2017-10-23 MED ORDER — LORAZEPAM 0.5 MG PO TABS: 0.5000 mg | ORAL_TABLET | Freq: Three times a day (TID) | ORAL | 0 refills | Status: DC | PRN

## 2017-10-23 NOTE — Progress Notes (Signed)
Hematology and Oncology Follow Up Visit  Jeremy Arnold [id number] Mar 11, 1946 73 y.o. 10/23/2017   Principle Diagnosis:  Locally advanced basal cell carcinoma of the left face  Current Therapy:   Referral placed to Radiation Oncology    Interim History:  Jeremy Arnold is here today with his ex-wife and daughter for follow-up after his ED visit on 1/13 for bleeding from the left side of his nose. He currently has an anticoagulant dressing on the left side of his nose at the site.  No other bleeding. He does bruise easily on Plavix.  MRI of the face reflected locally advanced basal cell carcinoma of the left face. He will see Dr. Sondra Come later this week for further workup for radiation.  He isn't sleeping well and is having fatigue. He states that he is waking up ever 2-3 hours with a dry mouth.  No fever, chills, n/v, cough, rash, dizziness, SOB, chest pain, palpitations, abdominal pain or changes in bowel or bladder habits.  No swelling, tenderness, numbness or tingling in his extremities. No c/o pain.  He has maintained a good appetite and is staying well hydrated. He has done well with Megace, his weight is up 8 lbs since his last visit.    ECOG Performance Status: 1 - Symptomatic but completely ambulatory  Medications:  Allergies as of 10/23/2017      Reactions   Shellfish Allergy Nausea And Vomiting, Other (See Comments)   Stomach cramp      Medication List        Accurate as of 10/23/17 10:48 AM. Always use your most recent med list.          aspirin 81 MG chewable tablet Chew 81 mg by mouth daily.   atorvastatin 40 MG tablet Commonly known as:  LIPITOR Take 40 mg by mouth daily.   clopidogrel 75 MG tablet Commonly known as:  PLAVIX Take 75 mg by mouth daily.   megestrol 625 MG/5ML suspension Commonly known as:  MEGACE ES Take 5 mLs (625 mg total) by mouth daily.   metoprolol tartrate 25 MG tablet Commonly known as:  LOPRESSOR Take 25 mg by mouth daily. Take with  50mg  tablet for a total dose of 75mg  twice a day   metoprolol tartrate 50 MG tablet Commonly known as:  LOPRESSOR Take 50 mg by mouth daily. Take with 25mg  tablet for a total dose of 75mg  twice a day   multivitamin with minerals Tabs tablet Take 1 tablet by mouth daily.       Allergies:  Allergies  Allergen Reactions  . Shellfish Allergy Nausea And Vomiting and Other (See Comments)    Stomach cramp    Past Medical History, Surgical history, Social history, and Family History were reviewed and updated.  Review of Systems: All other 10 point review of systems is negative.   Physical Exam:  weight is 129 lb 8 oz (58.7 kg). His oral temperature is 98.1 F (36.7 C). His blood pressure is 120/60 and his pulse is 84. His respiration is 20 and oxygen saturation is 100%.   Wt Readings from Last 3 Encounters:  10/23/17 129 lb 8 oz (58.7 kg)  10/11/17 121 lb 8 oz (55.1 kg)    Ocular: Sclerae unicteric, pupils equal, round and reactive to light Ear-nose-throat: Oropharynx clear, dentition fair Lymphatic: No cervical, supraclavicular or axillary adenopathy Lungs no rales or rhonchi, good excursion bilaterally Heart regular rate and rhythm, no murmur appreciated Abd soft, nontender, positive bowel sounds, no liver or spleen  tip palpated on exam, no fluid wave  MSK no focal spinal tenderness, no joint edema Neuro: non-focal, well-oriented, appropriate affect Breasts: Deferred    Lab Results  Component Value Date   WBC 6.7 10/12/2017   HGB 9.3 (L) 10/12/2017   HCT 28.8 (L) 10/12/2017   MCV 87 10/12/2017   PLT 179 10/12/2017   Lab Results  Component Value Date   FERRITIN 12 (L) 10/12/2017   IRON 22 (L) 10/12/2017   TIBC 374 10/12/2017   UIBC 352 (H) 10/12/2017   IRONPCTSAT 6 (L) 10/12/2017   Lab Results  Component Value Date   RBC 3.30 (L) 10/12/2017   No results found for: KPAFRELGTCHN, LAMBDASER, KAPLAMBRATIO No results found for: IGGSERUM, IGA, IGMSERUM No results  found for: TOTALPROTELP, ALBUMINELP, A1GS, A2GS, Violet Baldy, MSPIKE, SPEI   Chemistry      Component Value Date/Time   NA 138 10/12/2017 1425   K 5.6 (H) 10/12/2017 1425   CL 96 (L) 10/12/2017 1425   CO2 30 10/12/2017 1425   BUN 22 10/12/2017 1425   CREATININE 1.7 (H) 10/12/2017 1425      Component Value Date/Time   CALCIUM 10.6 (H) 10/12/2017 1425   ALKPHOS 90 (H) 10/12/2017 1425   AST 24 10/12/2017 1425   ALT 24 10/12/2017 1425   BILITOT 0.50 10/12/2017 1425      Impression and Plan: Jeremy Arnold is a very pleasant 72 yo caucasian gentleman with locally advanced basal cell carcinoma of the left face. He had a bleed from the left side of the nose on 1/13 and was treated in the ED.  Dr. Marin Olp was able to speak with Dr. Sondra Come and he will try and get the patient in this week.  We will having him try rinsing with water and baking soda and also the ACT dry mouth lozenges.  I sent in a prescription for Ativan 0.5 mg q 8 hours PRN for anxiety and also for sleep.  We will plan to see him back next week on the 24th.  He will contact our office with any questions or concerns. We can certainly see him sooner if need be.   Laverna Peace, NP 1/15/201910:48 AM

## 2017-10-31 ENCOUNTER — Other Ambulatory Visit: Payer: Self-pay | Admitting: *Deleted

## 2017-10-31 ENCOUNTER — Other Ambulatory Visit: Payer: Self-pay

## 2017-10-31 ENCOUNTER — Ambulatory Visit
Admission: RE | Admit: 2017-10-31 | Discharge: 2017-10-31 | Disposition: A | Discharge status: Home / Self Care | Payer: Medicare Other | Source: Ambulatory Visit | Attending: Radiation Oncology | Admitting: Radiation Oncology

## 2017-10-31 ENCOUNTER — Encounter: Payer: Self-pay | Admitting: Radiation Oncology

## 2017-10-31 ENCOUNTER — Encounter: Payer: Self-pay | Admitting: General Practice

## 2017-10-31 DIAGNOSIS — C44310 Basal cell carcinoma of skin of unspecified parts of face: Secondary | ICD-10-CM

## 2017-10-31 DIAGNOSIS — G4700 Insomnia, unspecified: Secondary | ICD-10-CM

## 2017-10-31 DIAGNOSIS — F418 Other specified anxiety disorders: Secondary | ICD-10-CM

## 2017-10-31 MED ORDER — LORAZEPAM 1 MG PO TABS: 1.0000 mg | ORAL_TABLET | Freq: Three times a day (TID) | ORAL | 0 refills | Status: DC

## 2017-10-31 NOTE — Progress Notes (Signed)
Radiation Oncology         [phone] ________________________________  Initial Outpatient Consultation  Name: Jeremy Arnold MRN: [id number]  Date: 10/31/2017  DOB: 1945/12/23  CC:Jeremy Napoleon, MD  Jeremy Napoleon, MD   REFERRING PHYSICIAN: Volanda Napoleon, MD  DIAGNOSIS: Presumptive advanced basal cell carcinoma of the left face  HISTORY OF PRESENT ILLNESS::Jeremy Arnold is a 72 y.o. male who is presenting today for further discussion regarding his presumed basal cell carcinoma involving his left sided face. He is accompanied by his ex-wife. He notes that he initially presented with a red area to the right bridge of his nose approximately 10 years ago. He reports that he had prior hx of basal cell carcinoma of his left year and his left sided nose. He was evaluated by Oncologist, Dr. Marin Olp on 10/11/17 for a probable basal cell carcinoma to the left side of his face. He hadn't undergone treatment at that time. Dr. Marin Olp referred the patient for further evaluation and radiation treatment. He is taking 0.5 mg Ativan (two at a time) to aid with his increased anxiety that was prescribed by Dr. Marin Olp. He denies taking any pain medication on a regular basis. He reports that he is retired and that he lives at home with his ex-wife and his older daughter. He was living in [place] up until recently   He was evaluated by the ED following a nasal bleeding on 10/21/17. He then had Surgicel moistened with TXA placed on his nose with relief of his symptoms. He reports that he is on Plavix and 81 mg ASA. He is wondering if he could have the gauze removed today. He is able to see light and dark but unable to see out of his left eye. He has had prior cataract surgery to his right eye.   He recently underwent MR Face/trigeminal on 10/22/17 with results showing: IMPRESSION: 1. Confluent and multifocal soft tissue masses in both the preseptal and postseptal spaces of the left orbit with associated  enophthalmos, involvement of multiple anterior extraocular muscles, and involvement and deformity of the left globe. Patchy involvement along the margins of the anterior left optic nerve, but no tumor is suspected within the substance of the left optic nerve. 2. No evidence of extension to the left orbital apex or into the cavernous sinus. No bilateral V2, V3, or main trigeminal nerve involvement. No other intracranial extension; no associated dural thickening or intracranial extension along the floor of the left anterior cranial fossa. 3. Tumor along the residual bilateral nose and a 2 cm area of involvement at the medial left premalar soft tissues adjacent to prior left nasal resection site. Tumor extension into the anterior cartilaginous nasal septum and across the left lamina papyracea into some of the left ethmoid air cells cannot be excluded.  On review of systems, he reports bilateral hand tremors and nasal congestion. He denies hearing loss to either ear and any other symptoms. He denies prior surgery in the head or neck area except involving his left ear.   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    PAST SURGICAL HISTORY: Past Surgical History:  Procedure Laterality Date  . CORONARY ARTERY BYPASS GRAFT  2001   x 3 vessel  . left ear     basal skin cancer removal  . STENT PLACEMENT VASCULAR ([place] HX)  bilateral groin    FAMILY HISTORY: family history is not on file.  SOCIAL HISTORY:  reports that he has been smoking.  He has a 50.00 pack-year smoking history. he has never used smokeless tobacco. He reports that he does not drink alcohol or use drugs.  ALLERGIES: Shellfish allergy  MEDICATIONS:  Current Outpatient Medications  Medication Sig Dispense Refill  . aspirin 81 MG chewable tablet Chew 81 mg by mouth  daily.    Marland Kitchen atorvastatin (LIPITOR) 40 MG tablet Take 40 mg by mouth daily.    . clopidogrel (PLAVIX) 75 MG tablet Take 75 mg by mouth daily.    . megestrol (MEGACE ES) 625 MG/5ML suspension Take 5 mLs (625 mg total) by mouth daily. 150 mL 3  . metoprolol tartrate (LOPRESSOR) 25 MG tablet Take 25 mg by mouth daily. Take with 50mg  tablet for a total dose of 75mg  twice a day    . metoprolol tartrate (LOPRESSOR) 50 MG tablet Take 50 mg by mouth daily. Take with 25mg  tablet for a total dose of 75mg  twice a day    . Multiple Vitamin (MULTIVITAMIN WITH MINERALS) TABS tablet Take 1 tablet by mouth daily.    Marland Kitchen LORazepam (ATIVAN) 1 MG tablet Take 1 tablet (1 mg total) by mouth every 8 (eight) hours. For anxiety or sleep 30 tablet 0   No current facility-administered medications for this encounter.     REVIEW OF SYSTEMS: REVIEW OF SYSTEMS: A 10+ POINT REVIEW OF SYSTEMS WAS OBTAINED including neurology, dermatology, psychiatry, cardiac, respiratory, lymph, extremities, GI, GU, musculoskeletal, constitutional, reproductive, HEENT. All pertinent positives are noted in the HPI. All others are negative.    PHYSICAL EXAM:  height is 5\' 9"  (1.753 m) and weight is 125 lb 9.6 oz (57 kg). His oral temperature is 98.8 F (37.1 C). His blood pressure is 147/69 (abnormal) and his pulse is 81. His oxygen saturation is 100%.   General: Alert and oriented, in no acute distress HEENT: Head is normocephalic. Pt has extensive malignancy involving the left face, including the left orbit and nose. Pt has enophthalmos of the left eye. Pt has significant light sensitivity involving the left eye. Only a portion of the left eye is visible. Pt has remaining Surgicel along left nose from recent nosebleed and ER visit. Oral cavity with no erosion into the hard or soft palate. Pt is edentulous. A portion of the left upper ear is missing from prior surgery. Neck: Neck is supple, no palpable cervical or supraclavicular lymphadenopathy. No  palpable upper neck or preauricular adenopathy. Heart: Regular in rate and rhythm with no murmurs, rubs, or gallops. Chest: Clear to auscultation bilaterally, with no rhonchi, wheezes, or rales. Abdomen: Soft, nontender, nondistended, with no rigidity or guarding. Extremities: No cyanosis or edema. Lymphatics: see Neck Exam Skin:   Musculoskeletal: symmetric strength and muscle tone throughout. Neurologic: Cranial nerves II through XII are grossly intact. No obvious focalities. Speech is fluent. Coordination is intact. Psychiatric: Judgment and insight are intact. Affect is appropriate.     ECOG = 1   LABORATORY DATA:  Lab Results  Component Value Date   WBC 8.9 10/23/2017   HGB 9.3 (L) 10/12/2017   HCT 26.9 (L) 10/23/2017   MCV 91.2 10/23/2017   PLT 220 10/23/2017   NEUTROABS 5.8 10/23/2017   Lab Results  Component Value Date   NA 132 (L) 10/23/2017   K 4.7 10/23/2017   CL 104 10/23/2017   CO2 21 (L) 10/23/2017   GLUCOSE 87  10/23/2017   CREATININE 1.7 (H) 10/12/2017   CALCIUM 7.8 (L) 10/23/2017      RADIOGRAPHY: Mr Face/trigeminal Wo/w Cm  Result Date: 10/22/2017 CLINICAL DATA:  72 year old male with basal cell skin cancer, with suspected bone involvement. Study for treatment planning. Patient reports visual problems, difficulty walking, and bilateral weakness. EXAM: MRI FACE TRIGEMINAL WITHOUT AND WITH CONTRAST TECHNIQUE: Multiplanar, multiecho pulse sequences of the face and surrounding structures, including thin slice imaging of the course of the Trigeminal Nerves, were obtained both before and after administration of intravenous contrast. CONTRAST:  30mL MULTIHANCE GADOBENATE DIMEGLUMINE 529 MG/ML IV SOLN COMPARISON:  None. FINDINGS: Major intracranial vascular flow voids are preserved. The brainstem and visible brain parenchyma appears within normal limits for age. No dural thickening along the left anterior cranial fossa superior to the abnormal left orbit (see below).  Normal bilateral cisternal 5th nerve segments. Normal bilateral Meckel's cave. Bilateral cavernous sinus is within normal limits. The bilateral V2 trunks appear symmetric and within normal limits (series 5, image 19 and series 10, image 20). The bilateral V3 trunks just caudal to the skull base appear symmetric and within normal limits (series 11, image 19). Likewise, the bilateral infraorbital and bilateral inferior alveolar nerves appear symmetric and within normal limits. Postoperative changes to the right globe. The right orbit soft tissues appear normal. However, there is left enophthalmos, distortion of the left globe contour, and multifocal enhancing soft tissue masses within the left orbit. Along the anterior portion of the left globe a confluent circular or comma-shaped soft tissue mass is demonstrated (series 3, image 32) up to 3.4 centimeters diameter/CC. Note the contiguous hyperenhancement (series 10, image 34) which is contiguous with the skin surface, and with what appears to be surgical defect along the left preseptal space (series 6, image 35). There is associated distortion of the left globe shaped, as well as asymmetric thickening and enhancement of the left retina and/or choroid. See series 11, images 29 and 30. The left lens appears spared. The soft tissue mass then tracks posteriorly into the postseptal compartment of the left orbit, with contiguous components in both the medial and lateral left orbit. Medially a confluent mass is inseparable from the superior rectus, medial rectus, and left superior oblique muscles (series 3, image 27). Laterally both the intraconal and extraconal space are affected with mass inseparable from the lateral rectus muscle (series 3, image 28). There is also more curvilinear, less mass like enhancement along the margins of the left optic nerve posterior to the left globe as seen on series 11, image 30. However, no abnormal enhancement within the substance of the  left optic nerve. The left orbital apex appears normal. There also a masslike soft tissue thickening affecting the residual superficial skin and dermis of the anterior nose (more so on the right as a portion of the left nose has been resected) and tracking toward the top lip. There is associated involvement of the medial left premalar soft tissues encompassing an area of 2-2.5 centimeters largest dimension as seen on series 11, image 20. There could be involvement of the anterior cartilaginous nasal septum. Ethmoid and left maxillary sinus mucosal thickening, as well as mucosal thickening and enhancement in the left nasal cavity with retained secretions appears to be benign and inflammatory. However, focal extension across the left lamina papyracea and into some of the left ethmoids is difficult to exclude (e.g. Series 10, image 30). The right side sinuses are clear. Negative visible pharynx, parapharyngeal spaces, retropharyngeal space, sublingual  space, submandibular spaces, and parotid spaces. No upper cervical lymphadenopathy identified. No definite abnormal bone marrow signal in the left face or orbit. Visible skull base and cervical spine marrow signal is within normal limits. Negative visible spinal cord. IMPRESSION: 1. Confluent and multifocal soft tissue masses in both the preseptal and postseptal spaces of the left orbit with associated enophthalmos, involvement of multiple anterior extraocular muscles, and involvement and deformity of the left globe. Patchy involvement along the margins of the anterior left optic nerve, but no tumor is suspected within the substance of the left optic nerve. 2. No evidence of extension to the left orbital apex or into the cavernous sinus. No bilateral V2, V3, or main trigeminal nerve involvement. No other intracranial extension; no associated dural thickening or intracranial extension along the floor of the left anterior cranial fossa. 3. Tumor along the residual bilateral  nose and a 2 cm area of involvement at the medial left premalar soft tissues adjacent to prior left nasal resection site. Tumor extension into the anterior cartilaginous nasal septum and across the left lamina papyracea into some of the left ethmoid air cells cannot be excluded. Electronically Signed   By: Genevie Ann M.D.   On: 10/22/2017 15:33      IMPRESSION: Extensive malignancy involving the left face, left orbit, and left nose. Presumably basal cell carcinoma. Pt has a remote hx of basal cell involving left ear. We have requested results of this surgery and biopsy from [place]. Given the interval since his prior surgery, I would like to request a biopsy of the current problem to document the presumptive diagnosis and help plan with potential other therapy after palliative radiation treatment. Pt will be set up with dermatology consultation with Dr. Elvera Lennox. Once tissue confirmation is obtained, pt will return for planning and radiation treatment. Anticipate approximately 6 weeks of radiation treatment. All relevant records and images related to the planned course of therapy were reviewed. The patient freely provided informed written consent to proceed with treatment after reviewing the details related to the planned course of therapy. The consent form was witnessed and verified by the nursing staff.   PLAN: Advised the patient to follow up with neurologist for further evaluation of bilateral hand tremors. Advised the patient to continue taking antihistamines to aid with congestion/breathing issues. Will follow up with provider in [place] for prior path reports. Will refer the patient to Dr. Elvera Lennox at [facility] for a biopsy of the area to confirm diagnosis prior to radiation treatment. Continue follow up appointment with Dr. Marin Olp. Will prescribe 1 mg Ativan to aid in sleeping issues.       ------------------------------------------------  Blair Promise, PhD, MD         This document serves as a record of services personally performed by Gery Pray, MD. It was created on his behalf by [facility], a trained medical scribe. The creation of this record is based on the scribe's personal observations and the provider's statements to them. This document has been checked and approved by the attending provider.

## 2017-10-31 NOTE — Progress Notes (Signed)
Histology and Location of Primary Skin Cancer: Locally advanced basal cell carcinoma of the left face  Jeremy Arnold presented with the following signs/symptoms, started about 10 years ago with a red area on the right bridge of his nose.  Past/Anticipated interventions by patient's surgeon/dermatologist for current problematic lesion, if any: patient reports having a biopsy done when he had left ear surgery which showed basal cell carcinoma.  Past skin cancers, if any:  1) Location/Histology/Intervention: basal cell carcinoma pinna of left ear  History of Blistering sunburns, if any: no  SAFETY ISSUES:  Prior radiation? no  Pacemaker/ICD? no  Possible current pregnancy? no  Is the patient on methotrexate? no  Current Complaints / other details:  MRI scheduled for 10/22/17.  Patient is here with his ex-wife.  He went to the ED for a nose bleed about a week ago and had a piece of guaze placed on his nose.  He is wondering if this needs to be removed.  He reports having trouble sleeping because he is not able to breath at night.  Ativan helps but he needs a refill.  He reports not being able to see out of his left eye except for having light sensitivity.    BP (!) 147/69 (BP Location: Right Arm, Patient Position: Sitting)   Pulse 81   Temp 98.8 F (37.1 C) (Oral)   Ht 5\' 9"  (1.753 m)   Wt 125 lb 9.6 oz (57 kg)   SpO2 100%   BMI 18.55 kg/m    Wt Readings from Last 3 Encounters:  10/31/17 125 lb 9.6 oz (57 kg)  10/23/17 129 lb 8 oz (58.7 kg)  10/11/17 121 lb 8 oz (55.1 kg)

## 2017-10-31 NOTE — Progress Notes (Signed)
[facility] Psychosocial Distress Screening Clinical Social Work  Clinical Social Work was referred by distress screening protocol.  The patient scored a 6 on the Psychosocial Distress Thermometer which indicates moderate distress. Clinical Social Worker Edwyna Shell to assess for distress and other psychosocial needs. Left VM requesting call back to discuss needs and possible support options.    ONCBCN DISTRESS SCREENING 10/31/2017  Screening Type Initial Screening  Distress experienced in past week (1-10) 6  Family Problem type Other (comment)  Emotional problem type Nervousness/Anxiety;Adjusting to appearance changes  Information Concerns Type Lack of info about diagnosis  Physical Problem type Sleep/insomnia;Getting around;Bathing/dressing;Breathing;Loss of appetitie;Changes in urination    Clinical Social Worker follow up needed: Yes.   Awaiting return call, left VM.  If yes, follow up plan: see above  Edwyna Shell, [place] Worker Phone:  [phone]

## 2017-11-01 ENCOUNTER — Other Ambulatory Visit: Payer: Self-pay

## 2017-11-01 ENCOUNTER — Inpatient Hospital Stay: Payer: Medicare Other

## 2017-11-01 ENCOUNTER — Encounter (HOSPITAL_COMMUNITY): Payer: Self-pay | Admitting: *Deleted

## 2017-11-01 ENCOUNTER — Inpatient Hospital Stay (HOSPITAL_COMMUNITY)
Admission: AD | Admit: 2017-11-01 | Discharge: 2017-11-03 | DRG: 607 | Disposition: A | Discharge status: Home / Self Care | Payer: Medicare Other | Source: Ambulatory Visit | Attending: Hematology & Oncology | Admitting: Hematology & Oncology

## 2017-11-01 ENCOUNTER — Inpatient Hospital Stay (HOSPITAL_BASED_OUTPATIENT_CLINIC_OR_DEPARTMENT_OTHER): Payer: Medicare Other | Admitting: Hematology & Oncology

## 2017-11-01 VITALS — BP 183/72 | HR 77 | Temp 98.1°F | Resp 16 | Wt 122.0 lb

## 2017-11-01 DIAGNOSIS — E43 Unspecified severe protein-calorie malnutrition: Secondary | ICD-10-CM

## 2017-11-01 DIAGNOSIS — D649 Anemia, unspecified: Secondary | ICD-10-CM | POA: Diagnosis present

## 2017-11-01 DIAGNOSIS — H5789 Other specified disorders of eye and adnexa: Secondary | ICD-10-CM

## 2017-11-01 DIAGNOSIS — F419 Anxiety disorder, unspecified: Secondary | ICD-10-CM

## 2017-11-01 DIAGNOSIS — Z66 Do not resuscitate: Secondary | ICD-10-CM | POA: Diagnosis present

## 2017-11-01 DIAGNOSIS — E860 Dehydration: Secondary | ICD-10-CM

## 2017-11-01 DIAGNOSIS — H02402 Unspecified ptosis of left eyelid: Secondary | ICD-10-CM | POA: Diagnosis present

## 2017-11-01 DIAGNOSIS — I96 Gangrene, not elsewhere classified: Secondary | ICD-10-CM | POA: Diagnosis present

## 2017-11-01 DIAGNOSIS — I2510 Atherosclerotic heart disease of native coronary artery without angina pectoris: Secondary | ICD-10-CM | POA: Diagnosis present

## 2017-11-01 DIAGNOSIS — Z91013 Allergy to seafood: Secondary | ICD-10-CM

## 2017-11-01 DIAGNOSIS — C44310 Basal cell carcinoma of skin of unspecified parts of face: Secondary | ICD-10-CM

## 2017-11-01 DIAGNOSIS — Z7902 Long term (current) use of antithrombotics/antiplatelets: Secondary | ICD-10-CM

## 2017-11-01 DIAGNOSIS — D500 Iron deficiency anemia secondary to blood loss (chronic): Secondary | ICD-10-CM | POA: Diagnosis present

## 2017-11-01 DIAGNOSIS — Z7982 Long term (current) use of aspirin: Secondary | ICD-10-CM

## 2017-11-01 DIAGNOSIS — Z951 Presence of aortocoronary bypass graft: Secondary | ICD-10-CM

## 2017-11-01 DIAGNOSIS — R52 Pain, unspecified: Secondary | ICD-10-CM

## 2017-11-01 DIAGNOSIS — C44319 Basal cell carcinoma of skin of other parts of face: Principal | ICD-10-CM | POA: Diagnosis present

## 2017-11-01 DIAGNOSIS — F17200 Nicotine dependence, unspecified, uncomplicated: Secondary | ICD-10-CM | POA: Diagnosis present

## 2017-11-01 LAB — CBC WITH DIFFERENTIAL (CANCER CENTER ONLY)
Basophils Absolute: 0.1 10*3/uL (ref 0.0–0.1)
Basophils Relative: 1 %
Eosinophils Absolute: 0.1 10*3/uL (ref 0.0–0.5)
Eosinophils Relative: 1 %
HCT: 33.3 % — ABNORMAL LOW (ref 38.7–49.9)
HEMOGLOBIN: 10.7 g/dL — AB (ref 13.0–17.1)
LYMPHS ABS: 1.9 10*3/uL (ref 0.9–3.3)
LYMPHS PCT: 24 %
MCH: 29.4 pg (ref 28.0–33.4)
MCHC: 32.1 g/dL (ref 32.0–35.9)
MCV: 91.5 fL (ref 82.0–98.0)
Monocytes Absolute: 0.8 10*3/uL (ref 0.1–0.9)
Monocytes Relative: 9 %
NEUTROS PCT: 65 %
Neutro Abs: 5.3 10*3/uL (ref 1.5–6.5)
Platelet Count: 234 10*3/uL (ref 140–400)
RBC: 3.64 MIL/uL — ABNORMAL LOW (ref 4.20–5.70)
RDW: 19.5 % — ABNORMAL HIGH (ref 11.1–15.7)
WBC Count: 8.2 10*3/uL (ref 4.0–10.3)

## 2017-11-01 LAB — CMP (CANCER CENTER ONLY)
ALK PHOS: 66 U/L (ref 26–84)
ALT: 21 U/L (ref 0–55)
AST: 27 U/L (ref 5–34)
Albumin: 3.8 g/dL (ref 3.5–5.0)
Anion gap: 6 (ref 5–15)
BILIRUBIN TOTAL: 0.6 mg/dL (ref 0.2–1.2)
BUN: 16 mg/dL (ref 7–22)
CO2: 25 mmol/L (ref 18–33)
Calcium: 9.4 mg/dL (ref 8.0–10.3)
Chloride: 105 mmol/L (ref 98–108)
Creatinine: 1.4 mg/dL — ABNORMAL HIGH (ref 0.70–1.30)
Glucose, Bld: 109 mg/dL (ref 70–118)
POTASSIUM: 4 mmol/L (ref 3.3–4.7)
Sodium: 136 mmol/L (ref 128–145)
Total Protein: 7 g/dL (ref 6.4–8.1)

## 2017-11-01 MED ORDER — ASPIRIN 81 MG PO CHEW: 81.0000 mg | CHEWABLE_TABLET | Freq: Every day | ORAL | Status: DC

## 2017-11-01 MED ORDER — SODIUM CHLORIDE 0.9 % IV SOLN
INTRAVENOUS | Status: DC
  Administered 2017-11-01 – 2017-11-02 (×2): via INTRAVENOUS

## 2017-11-01 MED ORDER — MEGESTROL ACETATE 625 MG/5ML PO SUSP: 625.0000 mg | Freq: Every day | ORAL | Status: DC

## 2017-11-01 MED ORDER — DIPHENHYDRAMINE HCL 25 MG PO CAPS
25.0000 mg | ORAL_CAPSULE | Freq: Once | ORAL | Status: AC
  Administered 2017-11-01: 25 mg via ORAL
  Filled 2017-11-01: qty 1

## 2017-11-01 MED ORDER — METOPROLOL TARTRATE 50 MG PO TABS
75.0000 mg | ORAL_TABLET | Freq: Every day | ORAL | Status: DC
  Administered 2017-11-02 – 2017-11-03 (×2): 75 mg via ORAL
  Filled 2017-11-01 (×2): qty 1

## 2017-11-01 MED ORDER — METOPROLOL TARTRATE 25 MG PO TABS: 25.0000 mg | ORAL_TABLET | Freq: Every day | ORAL | Status: DC

## 2017-11-01 MED ORDER — SODIUM CHLORIDE 0.9 % IV SOLN: Freq: Once | INTRAVENOUS | Status: DC

## 2017-11-01 MED ORDER — LORAZEPAM 2 MG/ML IJ SOLN
1.0000 mg | INTRAMUSCULAR | Status: DC | PRN
  Administered 2017-11-02 – 2017-11-03 (×4): 1 mg via INTRAVENOUS
  Filled 2017-11-01 (×4): qty 1

## 2017-11-01 MED ORDER — ACETAMINOPHEN 325 MG PO TABS
650.0000 mg | ORAL_TABLET | Freq: Once | ORAL | Status: AC
  Administered 2017-11-01: 650 mg via ORAL
  Filled 2017-11-01: qty 2

## 2017-11-01 MED ORDER — HYDROMORPHONE HCL 1 MG/ML IJ SOLN
1.0000 mg | INTRAMUSCULAR | Status: DC | PRN
  Administered 2017-11-02 – 2017-11-03 (×2): 1 mg via INTRAVENOUS
  Filled 2017-11-01 (×2): qty 1

## 2017-11-01 MED ORDER — LORAZEPAM 1 MG PO TABS: 1.0000 mg | ORAL_TABLET | Freq: Three times a day (TID) | ORAL | Status: DC

## 2017-11-01 MED ORDER — METOPROLOL TARTRATE 50 MG PO TABS: 50.0000 mg | ORAL_TABLET | Freq: Every day | ORAL | Status: DC

## 2017-11-01 MED ORDER — MEGESTROL ACETATE 400 MG/10ML PO SUSP
625.0000 mg | Freq: Every day | ORAL | Status: DC
  Administered 2017-11-01 – 2017-11-03 (×3): 625 mg via ORAL
  Filled 2017-11-01 (×3): qty 20

## 2017-11-01 MED ORDER — ATORVASTATIN CALCIUM 40 MG PO TABS
40.0000 mg | ORAL_TABLET | Freq: Every day | ORAL | Status: DC
  Administered 2017-11-02 – 2017-11-03 (×2): 40 mg via ORAL
  Filled 2017-11-01 (×2): qty 1

## 2017-11-01 NOTE — H&P (Signed)
Referral MD  Reason for Referral: Bleeding under the left eye secondary to basal cell carcinoma; platelet dysfunction secondary to Plavix  No chief complaint on file. : I have bleeding from my left eye.  HPI: Jeremy Arnold is a 72 year old white male.  He just moved here from [place].  Unfortunate, there were no records from [place].  He apparently has a basal cell carcinoma of the face.  He never had this treated.  It has eroded into his nose and left eye.  He recently had an MRI of the face done.  This showed extensive disease on the left side of his face.  He saw Dr. Sondra Come of radiation oncology.  Dr. Sondra Come wanted to have some additional information on him and a biopsy before he would treat him.  He is on Plavix and baby aspirin.  He began to have some bleeding under the left eye today.  This really would not stop.  He was seen in our office.  His blood counts looked okay.  His white cell count is 8.2.  Hemoglobin 10.7.  Platelet count 234,000.  Because of the Plavix, I felt that his platelets were not working.  We stopped his Plavix.  We cannot transfuse him in the office because we were too busy.  As such, I am admitting him for hopefully an 23-hour observation stay for a platelet transfusion has some IV fluids to try to make her feel better.  He really does not like having much done to him.  As such, we have to be careful with trying to do a lot.  I really have not addressed CODE STATUS with him as of yet.     Past Medical History:  Diagnosis Date  . Iron deficiency anemia due to chronic blood loss 10/12/2017  . 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 Skin cancer   :  Past Surgical History:  Procedure Laterality Date  . CORONARY ARTERY BYPASS GRAFT  2001   x 3 vessel  . left ear     basal skin cancer removal  . STENT PLACEMENT VASCULAR ([place] HX)     bilateral groin  :   Current Facility-Administered Medications:  .  0.9 %  sodium chloride infusion, ,  Intravenous, Continuous, Ennever, Peter R, MD .  0.9 %  sodium chloride infusion, , Intravenous, Once, Ennever, Rudell Cobb, MD .  acetaminophen (TYLENOL) tablet 650 mg, 650 mg, Oral, Once, Ennever, Rudell Cobb, MD .  atorvastatin (LIPITOR) tablet 40 mg, 40 mg, Oral, Daily, Ennever, Rudell Cobb, MD .  diphenhydrAMINE (BENADRYL) capsule 25 mg, 25 mg, Oral, Once, Ennever, Rudell Cobb, MD .  LORazepam (ATIVAN) tablet 1 mg, 1 mg, Oral, Q8H, Ennever, Rudell Cobb, MD .  megestrol (MEGACE ES) 625 MG/5ML suspension 625 mg, 625 mg, Oral, Daily, Ennever, Rudell Cobb, MD .  metoprolol tartrate (LOPRESSOR) tablet 25 mg, 25 mg, Oral, Daily, Ennever, Peter R, MD .  metoprolol tartrate (LOPRESSOR) tablet 50 mg, 50 mg, Oral, Daily, Ennever, Rudell Cobb, MD:  . acetaminophen  650 mg Oral Once  . atorvastatin  40 mg Oral Daily  . diphenhydrAMINE  25 mg Oral Once  . LORazepam  1 mg Oral Q8H  . megestrol  625 mg Oral Daily  . metoprolol tartrate  25 mg Oral Daily  . metoprolol tartrate  50 mg Oral Daily  :  Allergies  Allergen Reactions  . Shellfish Allergy Nausea And Vomiting and Other (See Comments)    Stomach cramp  :  No family  history on file.:  Social History   Socioeconomic History  . Marital status: Divorced    Spouse name: Not on file  . Number of children: Not on file  . Years of education: Not on file  . Highest education level: Not on file  Social Needs  . Financial resource strain: Not on file  . Food insecurity - worry: Not on file  . Food insecurity - inability: Not on file  . Transportation needs - medical: Not on file  . Transportation needs - non-medical: Not on file  Occupational History  . Not on file  Tobacco Use  . Smoking status: Current Every Day Smoker    Packs/day: 1.00    Years: 50.00    Pack years: 50.00  . Smokeless tobacco: Never Used  Substance and Sexual Activity  . Alcohol use: No    Frequency: Never  . Drug use: No  . Sexual activity: Not on file  Other Topics Concern  . Not  on file  Social History Narrative  . Not on file  :  Pertinent items are noted in HPI.  Exam: Facial exam shows a dressing on his nose.  He has ptosis of the left eye.  There is some bleeding coming from the inferior portion of the left eye.  He does have some decreased range of motion of his jaw.  The right side of his face is pretty much unremarkable.  There is no adenopathy in the neck.  His lungs are clear.  Cardiac exam regular rate and rhythm.  Abdomen is soft.  Bowel sounds are slightly decreased. No data found.   Recent Labs    11/01/17 1108  WBC 8.2  HCT 33.3*  PLT 234   Recent Labs    11/01/17 1108  NA 136  K 4.0  CL 105  CO2 25  GLUCOSE 109  BUN 16  CALCIUM 9.4    Blood smear review: None  Pathology: None    Assessment and Plan: Jeremy Arnold is a 72 year old white male.  He has a advanced basal cell carcinoma of the face.  I had to believe that the bleeding is likely coming from a lesion under the eye in the infraorbital area.  Again, he has platelet dysfunction secondary to the Plavix.  We will give him a platelet transfusion.  Hopefully this will help with the bleeding.  We will stop the Plavix.  He does have a lot of anxiety.  We will have him take some Ativan.  We will see about some pain medication for him.  Again, hopefully, we can get him out tomorrow.  IV fluids I think will help him feel a little bit better.  Thankfully, his ex-wife is doing a great job trying to help him.  She actually is the one who brought him over from [place] since he had no one there to help take care of him.  Lattie Haw, MD

## 2017-11-01 NOTE — Progress Notes (Signed)
Hematology and Oncology Follow Up Visit  Jeremy Arnold [id number] 01/08/1946 72 y.o. 11/01/2017   Principle Diagnosis:   Locally advanced basal cell carcinoma of the left face  Current Therapy:    Observation     Interim History:  Jeremy Arnold is in for an unscheduled visit.  His ex-wife called this morning saying that he may bleeding from his left eye.  This would not stop.  Unfortunately, he is on Plavix and baby aspirin.  I am sure this is probably the reason that the bleeding would not stop.  He is seeing Dr. Sondra Come of radiation oncology.  Dr. Sondra Come wants to get a biopsy before he starts any radiation on him.  I am not sure when this will be done.  He is having a little more pain.  He really has not had pain on the left side of his face.  Apparently he is having some more discomfort.  He did have an MRI of his face recently.  This showed fairly extensive erosion into the nasal sinuses and left orbital region.  He is on Ativan for some anxiety.  He does look a little bit dehydrated.  I think he is going to have to be admitted.  We cannot give him platelets in the office because we are full today.  He is not eating all that much.  Currently, his performance status is ECOG 2-3.  Medications: No current facility-administered medications for this visit.  No current outpatient medications on file.  Facility-Administered Medications Ordered in Other Visits:  .  0.9 %  sodium chloride infusion, , Intravenous, Continuous, Makeyla Govan, Rudell Cobb, MD .  0.9 %  sodium chloride infusion, , Intravenous, Once, Dewane Timson, Rudell Cobb, MD .  acetaminophen (TYLENOL) tablet 650 mg, 650 mg, Oral, Once, Cullan Launer, Rudell Cobb, MD .  Derrill Memo ON 11/02/2017] atorvastatin (LIPITOR) tablet 40 mg, 40 mg, Oral, Daily, Haidynn Almendarez, Rudell Cobb, MD .  diphenhydrAMINE (BENADRYL) capsule 25 mg, 25 mg, Oral, Once, Nyzaiah Kai, Rudell Cobb, MD .  HYDROmorphone (DILAUDID) injection 1 mg, 1 mg, Intravenous, Q4H PRN, Volanda Napoleon, MD .   LORazepam (ATIVAN) injection 1 mg, 1 mg, Intravenous, Q4H PRN, Volanda Napoleon, MD .  megestrol (MEGACE) 400 MG/10ML suspension 625 mg, 625 mg, Oral, Daily, Maximilian Tallo, Rudell Cobb, MD .  Derrill Memo ON 11/02/2017] metoprolol tartrate (LOPRESSOR) tablet 75 mg, 75 mg, Oral, Daily, Irianna Gilday, Rudell Cobb, MD  Allergies:  Allergies  Allergen Reactions  . Shellfish Allergy Nausea And Vomiting and Other (See Comments)    Stomach cramp    Past Medical History, Surgical history, Social history, and Family History were reviewed and updated.  Review of Systems: Review of Systems  Constitutional: Positive for fatigue and unexpected weight change.  HENT:   Positive for sore throat and trouble swallowing.   Eyes: Positive for eye problems.  Respiratory: Negative.   Cardiovascular: Negative.   Gastrointestinal: Negative.   Endocrine: Negative.   Genitourinary: Negative.    Musculoskeletal: Positive for myalgias.  Skin: Positive for wound.  Neurological: Negative.   Hematological: Bruises/bleeds easily.  Psychiatric/Behavioral: Negative.     Physical Exam:  weight is 122 lb (55.3 kg). His oral temperature is 98.1 F (36.7 C). His blood pressure is 183/72 (abnormal) and his pulse is 77. His respiration is 16 and oxygen saturation is 100%.   Wt Readings from Last 3 Encounters:  11/01/17 124 lb 14.4 oz (56.7 kg)  11/01/17 122 lb (55.3 kg)  10/31/17 125 lb 9.6 oz (57 kg)  He has bony erosion on the left side of his face.  There is blood from the inferior aspect of his left eye.  He has ptosis of the left eye.  He has a dressing over his nose.  He has some decreased range of motion of his jaw.  There is no adenopathy in the neck.  Lungs are clear.  Cardiac exam regular rate and rhythm.  Abdomen is soft.  Extremities shows muscle atrophy in upper and lower extremities  Lab Results  Component Value Date   WBC 8.2 11/01/2017   HGB 9.3 (L) 10/12/2017   HCT 33.3 (L) 11/01/2017   MCV 91.5 11/01/2017   PLT 234  11/01/2017     Chemistry      Component Value Date/Time   NA 136 11/01/2017 1108   NA 138 10/12/2017 1425   K 4.0 11/01/2017 1108   K 5.6 (H) 10/12/2017 1425   CL 105 11/01/2017 1108   CL 96 (L) 10/12/2017 1425   CO2 25 11/01/2017 1108   CO2 30 10/12/2017 1425   BUN 16 11/01/2017 1108   BUN 22 10/12/2017 1425   CREATININE 1.7 (H) 10/12/2017 1425      Component Value Date/Time   CALCIUM 9.4 11/01/2017 1108   CALCIUM 10.6 (H) 10/12/2017 1425   ALKPHOS 66 11/01/2017 1108   ALKPHOS 90 (H) 10/12/2017 1425   AST 27 11/01/2017 1108   ALT 21 11/01/2017 1108   ALT 24 10/12/2017 1425   BILITOT 0.6 11/01/2017 1108         Impression and Plan: Jeremy Arnold is a 72 year old white male.  He has untreated basal cell carcinoma of the left face.  He has bleeding from the left face.  This is coming from the infraorbital area.  He has platelet dysfunction secondary to Plavix.  I am sure that some of his bleeding is probably from tumor.  I would have to see if radiation oncology can see him and start some palliative radiation.  He is going to have to be admitted.  I have done an admission note on him.   Volanda Napoleon, MD 1/24/20196:04 PM

## 2017-11-02 ENCOUNTER — Telehealth: Payer: Self-pay | Admitting: *Deleted

## 2017-11-02 ENCOUNTER — Other Ambulatory Visit: Payer: Self-pay | Admitting: Oncology

## 2017-11-02 DIAGNOSIS — I2510 Atherosclerotic heart disease of native coronary artery without angina pectoris: Secondary | ICD-10-CM | POA: Diagnosis present

## 2017-11-02 DIAGNOSIS — I96 Gangrene, not elsewhere classified: Secondary | ICD-10-CM | POA: Diagnosis present

## 2017-11-02 DIAGNOSIS — F419 Anxiety disorder, unspecified: Secondary | ICD-10-CM | POA: Diagnosis present

## 2017-11-02 DIAGNOSIS — D500 Iron deficiency anemia secondary to blood loss (chronic): Secondary | ICD-10-CM | POA: Diagnosis present

## 2017-11-02 DIAGNOSIS — C44319 Basal cell carcinoma of skin of other parts of face: Secondary | ICD-10-CM | POA: Diagnosis present

## 2017-11-02 DIAGNOSIS — Z91013 Allergy to seafood: Secondary | ICD-10-CM | POA: Diagnosis not present

## 2017-11-02 DIAGNOSIS — H02402 Unspecified ptosis of left eyelid: Secondary | ICD-10-CM | POA: Diagnosis present

## 2017-11-02 DIAGNOSIS — Z7902 Long term (current) use of antithrombotics/antiplatelets: Secondary | ICD-10-CM | POA: Diagnosis not present

## 2017-11-02 DIAGNOSIS — Z7982 Long term (current) use of aspirin: Secondary | ICD-10-CM | POA: Diagnosis not present

## 2017-11-02 DIAGNOSIS — D649 Anemia, unspecified: Secondary | ICD-10-CM | POA: Diagnosis present

## 2017-11-02 DIAGNOSIS — F17200 Nicotine dependence, unspecified, uncomplicated: Secondary | ICD-10-CM | POA: Diagnosis present

## 2017-11-02 DIAGNOSIS — Z951 Presence of aortocoronary bypass graft: Secondary | ICD-10-CM | POA: Diagnosis not present

## 2017-11-02 DIAGNOSIS — C44310 Basal cell carcinoma of skin of unspecified parts of face: Secondary | ICD-10-CM

## 2017-11-02 DIAGNOSIS — Z66 Do not resuscitate: Secondary | ICD-10-CM | POA: Diagnosis present

## 2017-11-02 LAB — CBC
HCT: 26.6 % — ABNORMAL LOW (ref 39.0–52.0)
Hemoglobin: 8.8 g/dL — ABNORMAL LOW (ref 13.0–17.0)
MCH: 29.6 pg (ref 26.0–34.0)
MCHC: 33.1 g/dL (ref 30.0–36.0)
MCV: 89.6 fL (ref 78.0–100.0)
PLATELETS: 281 10*3/uL (ref 150–400)
RBC: 2.97 MIL/uL — AB (ref 4.22–5.81)
RDW: 19.5 % — AB (ref 11.5–15.5)
WBC: 7.1 10*3/uL (ref 4.0–10.5)

## 2017-11-02 LAB — PREPARE RBC (CROSSMATCH)

## 2017-11-02 LAB — PROTIME-INR
INR: 1.05
PROTHROMBIN TIME: 13.6 s (ref 11.4–15.2)

## 2017-11-02 LAB — ABO/RH: ABO/RH(D): A POS

## 2017-11-02 LAB — BPAM PLATELET PHERESIS
BLOOD PRODUCT EXPIRATION DATE: 201901272359
ISSUE DATE / TIME: 201901242302
UNIT TYPE AND RH: 8400

## 2017-11-02 LAB — PREPARE PLATELET PHERESIS: Unit division: 0

## 2017-11-02 LAB — APTT: aPTT: 31 seconds (ref 24–36)

## 2017-11-02 MED ORDER — BOOST PLUS PO LIQD
237.0000 mL | Freq: Two times a day (BID) | ORAL | Status: DC
  Administered 2017-11-02: 237 mL via ORAL
  Filled 2017-11-02 (×3): qty 237

## 2017-11-02 MED ORDER — FERUMOXYTOL INJECTION 510 MG/17 ML
510.0000 mg | Freq: Once | INTRAVENOUS | Status: AC
  Administered 2017-11-02: 510 mg via INTRAVENOUS
  Filled 2017-11-02: qty 17

## 2017-11-02 MED ORDER — SODIUM CHLORIDE 0.9 % IV SOLN
Freq: Once | INTRAVENOUS | Status: AC
  Administered 2017-11-02: 13:00:00 via INTRAVENOUS

## 2017-11-02 NOTE — Telephone Encounter (Signed)
Called patient to inform of appt. with Dr. Elvera Lennox (dermatalogist) on 11-05-17 - arrival time - 11 am, spoke with patient's sgo - Pierce Crane and she is aware of this appt.

## 2017-11-02 NOTE — Progress Notes (Signed)
Mr. Jeremy Arnold is feeling better this morning.  There really is no bleeding from the left eye.  I think that the platelet transfusion has helped.  He feels more alert.  I think the IV fluids have helped a little bit.  Today, his hemoglobin is 8.8.  I really believe that he would benefit from a blood transfusion.  Blood will help in the viscosity properties and I think will further decrease any bleeding.  He is off the Plavix and aspirin right now.  He says he is not in any pain.  I talked to him about a blood transfusion this morning.  I explained how a blood transfusion works.  I explained why I felt it would be beneficial.  He does understand this.  He is agreeable to having the transfusion.  I think 1 unit is all we will need.  I also talked to him regarding his CODE STATUS and end-of-life issues.  He was fully cognizant during our conversation.  He does not want to be kept alive on a machine.  He does not want to go through CPR or defibrillation.  If something were to happen to him, he would prefer to just pass peacefully.  I totally agree with this.  As such, he is a DO NOT RESUSCITATE.  On his physical exam, his temperature is 97.6.  Pulse 58.  Blood pressure 118/56.  His head and neck exam shows no active bleeding from the left orbit.  I did pull off the dressing under his left eye.  There is no blood that I could detect.  His lungs were clear.  Cardiac exam regular rate and rhythm.  He still might be able to go home today.  He is a 23-hour observation.  I would like to give him 1 unit of blood.  Again I think this will improve the viscosity of his blood.  I appreciate all the care that he is getting from the staff on [address].  Lattie Haw, MD  Vonna Kotyk 1:9

## 2017-11-02 NOTE — Telephone Encounter (Signed)
Patient has an appt. with Dr. Elvera Lennox on 11-05-17 - arrival time - 11 am

## 2017-11-02 NOTE — Progress Notes (Signed)
Initial Nutrition Assessment  DOCUMENTATION CODES:   Severe malnutrition in context of chronic illness  INTERVENTION:   Boost Plus po BID, each supplement provides 360 kcal and 14 grams protein  Emphasized the importance of adequate calorie and protein consumption, provided examples  RD will continue to monitor  NUTRITION DIAGNOSIS:   Severe Malnutrition related to chronic illness, cancer and cancer related treatments as evidenced by severe muscle depletion, severe fat depletion.  GOAL:   Patient will meet greater than or equal to 90% of their needs  MONITOR:   PO intake, Supplement acceptance, Weight trends, I & O's, Diet advancement  REASON FOR ASSESSMENT:   Malnutrition Screening Tool    ASSESSMENT:   Pt with of basal cell carcinoma of the face presents with bleeding under the left eye.    Per Oncology note, pt received platelet transfusion and is agreeable to blood transfusion.  Spoke with pt and pt's ex-wife at bedside. Together report pt's appetite has remained poor for the past 6 months. Reports he consumes only 2 meals per day, examples are 1 waffle and a pack of mac and cheese. Per meal completion records, pt consumed 50% of breakfast this morning.  Pt endorses weight loss. Per report, he has lost 40 lbs in < 1 year. Reported UBW of 155-160 lbs. This is >/= 20% weight loss in 1 year or less, significant for time frame. No weight history available as pt was residing in [place].   RD spent most of today's visit emphasizing the importance of adequate calorie and protein consumption. Providing nutritious snack and meal examples to try. Recommended small frequent meals to promote intake.   Labs reviewed; Hemoglobin 8.8, HCT 26.6 Medications reviewed; 625 mg Megace  NUTRITION - FOCUSED PHYSICAL EXAM:    Most Recent Value  Orbital Region  Unable to assess  Upper Arm Region  Severe depletion  Thoracic and Lumbar Region  Moderate depletion  Buccal Region  Severe  depletion  Temple Region  Moderate depletion  Clavicle Bone Region  Severe depletion  Clavicle and Acromion Bone Region  Severe depletion  Scapular Bone Region  Unable to assess  Dorsal Hand  Moderate depletion  Patellar Region  Severe depletion  Anterior Thigh Region  Severe depletion  Posterior Calf Region  Severe depletion  Edema (RD Assessment)  None      Diet Order:  Diet regular Room service appropriate? Yes; Fluid consistency: Thin  EDUCATION NEEDS:   Education needs have been addressed  Skin:  Skin Assessment: Skin Integrity Issues: Skin Integrity Issues:: Other (Comment) Other: L eye/face(open draining wound)   Last BM:  10/31/17  Height:   Ht Readings from Last 1 Encounters:  10/31/17 _0  (1.753 m)    Weight:   Wt Readings from Last 1 Encounters:  11/01/17 124 lb 14.4 oz (56.7 kg)    Ideal Body Weight:  72.7 kg  BMI:  Body mass index is 18.44 kg/m.  Estimated Nutritional Needs:   Kcal:  1700-1900  Protein:  85-100 grams  Fluid:  >/= 1.7 L/d  Parks Ranger, MS, RDN, LDN 11/02/2017 11:44 AM

## 2017-11-03 DIAGNOSIS — E43 Unspecified severe protein-calorie malnutrition: Secondary | ICD-10-CM

## 2017-11-03 DIAGNOSIS — D649 Anemia, unspecified: Secondary | ICD-10-CM

## 2017-11-03 DIAGNOSIS — I2510 Atherosclerotic heart disease of native coronary artery without angina pectoris: Secondary | ICD-10-CM

## 2017-11-03 DIAGNOSIS — Z862 Personal history of diseases of the blood and blood-forming organs and certain disorders involving the immune mechanism: Secondary | ICD-10-CM

## 2017-11-03 LAB — CBC WITH DIFFERENTIAL/PLATELET
BASOS ABS: 0 10*3/uL (ref 0.0–0.1)
BASOS PCT: 1 %
EOS ABS: 0.1 10*3/uL (ref 0.0–0.7)
EOS PCT: 1 %
HCT: 30.7 % — ABNORMAL LOW (ref 39.0–52.0)
Hemoglobin: 10.2 g/dL — ABNORMAL LOW (ref 13.0–17.0)
Lymphocytes Relative: 27 %
Lymphs Abs: 1.4 10*3/uL (ref 0.7–4.0)
MCH: 29.6 pg (ref 26.0–34.0)
MCHC: 33.2 g/dL (ref 30.0–36.0)
MCV: 89 fL (ref 78.0–100.0)
MONO ABS: 0.5 10*3/uL (ref 0.1–1.0)
Monocytes Relative: 11 %
Neutro Abs: 3 10*3/uL (ref 1.7–7.7)
Neutrophils Relative %: 60 %
PLATELETS: 255 10*3/uL (ref 150–400)
RBC: 3.45 MIL/uL — AB (ref 4.22–5.81)
RDW: 18.3 % — AB (ref 11.5–15.5)
WBC: 5 10*3/uL (ref 4.0–10.5)

## 2017-11-03 LAB — COMPREHENSIVE METABOLIC PANEL
ALBUMIN: 3 g/dL — AB (ref 3.5–5.0)
ALT: 14 U/L — ABNORMAL LOW (ref 17–63)
AST: 20 U/L (ref 15–41)
Alkaline Phosphatase: 55 U/L (ref 38–126)
Anion gap: 5 (ref 5–15)
BUN: 17 mg/dL (ref 6–20)
CHLORIDE: 105 mmol/L (ref 101–111)
CO2: 21 mmol/L — AB (ref 22–32)
Calcium: 8.3 mg/dL — ABNORMAL LOW (ref 8.9–10.3)
Creatinine, Ser: 1.1 mg/dL (ref 0.61–1.24)
GFR calc Af Amer: >60 mL/min (ref >60)
Glucose, Bld: 97 mg/dL (ref 65–99)
POTASSIUM: 4 mmol/L (ref 3.5–5.1)
SODIUM: 131 mmol/L — AB (ref 135–145)
Total Bilirubin: 0.8 mg/dL (ref 0.3–1.2)
Total Protein: 5.9 g/dL — ABNORMAL LOW (ref 6.5–8.1)

## 2017-11-03 LAB — PREALBUMIN: PREALBUMIN: 17.4 mg/dL — AB (ref 18–38)

## 2017-11-03 MED ORDER — ACETAMINOPHEN 500 MG PO TABS: 500.0000 mg | ORAL_TABLET | Freq: Four times a day (QID) | ORAL | Status: DC | PRN

## 2017-11-03 MED ORDER — ACETAMINOPHEN 500 MG PO TABS: 500.0000 mg | ORAL_TABLET | Freq: Four times a day (QID) | ORAL | 0 refills | Status: AC | PRN

## 2017-11-03 NOTE — Discharge Summary (Signed)
Physician Discharge Summary  Patient ID: Jeremy Arnold @[email or web address] @ MRN: [id number] DOB/AGE: 02-09-1946 72 y.o.  Admit date: 11/01/2017 Discharge date: 11/03/2017  Discharge Diagnoses:  1.  Basal cell carcinoma of the face 2.  Anemia 3.  Bleeding from the left face tumor 4.  History of hypercalcemia 5.  Coronary artery disease  Discharged Condition: Improved  Discharge Labs: 11/03/2017-hemoglobin 10.2  Significant Diagnostic Studies: None  Consults: None  Procedures: Platelet transfusion, red blood cell transfusion  Disposition: 01-Home or Self Care   Allergies as of 11/03/2017      Reactions   Shellfish Allergy Nausea And Vomiting, Other (See Comments)   Stomach cramp      Medication List    STOP taking these medications   aspirin 81 MG chewable tablet   clopidogrel 75 MG tablet Commonly known as:  PLAVIX     TAKE these medications   acetaminophen 500 MG tablet Commonly known as:  TYLENOL Take 1 tablet (500 mg total) by mouth every 6 (six) hours as needed for mild pain.   ALLERGY RELIEF 10 MG tablet Generic drug:  loratadine Take 10 mg by mouth daily as needed for allergies.   atorvastatin 40 MG tablet Commonly known as:  LIPITOR Take 40 mg by mouth daily.   LORazepam 1 MG tablet Commonly known as:  ATIVAN Take 1 tablet (1 mg total) by mouth every 8 (eight) hours. For anxiety or sleep   megestrol 625 MG/5ML suspension Commonly known as:  MEGACE ES Take 5 mLs (625 mg total) by mouth daily.   metoprolol tartrate 25 MG tablet Commonly known as:  LOPRESSOR Take 25 mg by mouth daily. Take with 50mg  tablet for a total dose of 75mg  once a day   metoprolol tartrate 50 MG tablet Commonly known as:  LOPRESSOR Take 50 mg by mouth daily. Take with 25mg  tablet for a total dose of 75mg  once a day   multivitamin with minerals Tabs tablet Take 1 tablet by mouth daily.         Hospital Course: Mr. Jeremy Arnold was admitted 11/01/2017 with bleeding from the left  face basal cell carcinoma.  The bleeding was felt to in part be related to use of aspirin and Plavix.  These were discontinued.  He was transfused 1 unit of platelets 11/01/2017.  He was transfused a unit of packed red blood cells 11/02/2017.  The anemia improved following the red cell transfusion.  When I evaluated him on 11/03/2016 there was no active bleeding at the left face tumor.  There was crusted blood surrounding the left eye and nose.  I discussed potential treatment options with Mr. Jeremy Arnold including radiation and systemic therapy.  He will return to the emergency room for recurrent bleeding.  He appears stable for discharge on 11/03/2017.  Discharge exam: HEENT- necrotic mass involving the left nose, face, and orbit with multiple scabbed areas, no active bleeding Cardiac-regular rate and rhythm Lungs-distant breath sounds, no respiratory distress Skin-multiple nodular skin lesions: Right face, left leg       Signed: Betsy Coder, MD 11/03/2017, 12:39 PM

## 2017-11-03 NOTE — Discharge Instructions (Signed)
Go to the emergency room for recurrent bleeding

## 2017-11-03 NOTE — Progress Notes (Signed)
Patient discharged to home with family, discharge instructions reviewed with patient and family who verbalized understanding. No new RX. °

## 2017-11-04 LAB — TYPE AND SCREEN
ABO/RH(D): A POS
Antibody Screen: NEGATIVE
UNIT DIVISION: 0

## 2017-11-04 LAB — BPAM RBC
BLOOD PRODUCT EXPIRATION DATE: 201902082359
ISSUE DATE / TIME: 201901251256
Unit Type and Rh: 6200

## 2017-11-06 ENCOUNTER — Telehealth: Payer: Self-pay | Admitting: *Deleted

## 2017-11-06 ENCOUNTER — Ambulatory Visit: Payer: Medicare Other | Admitting: Radiation Oncology

## 2017-11-06 ENCOUNTER — Telehealth: Payer: Self-pay | Admitting: Oncology

## 2017-11-06 NOTE — Telephone Encounter (Signed)
Patient's ex wife is calling the office with concerns about patient's condition. She states that his condition has rapidly deteriorated since being discharged from the hospital. He's not eating, has continued weight loss, is sleeping greater than 22hrs per day, is unable to walk and seems to have a constant tremor.   The wife would like a conversation with the physician regarding his end of life expectations and whether or not treatments will help at this point.  Reviewed with Dr Marin Olp. Dr Marin Olp would call patient's ex wife later today.

## 2017-11-06 NOTE — Telephone Encounter (Signed)
Delcie Roch called back and said they did not get the messages about [facility] today and will not be able to make it.  She also said that he did go to the dermatologist yesterday and had a biopsy of his face and also a place on his leg.  She said the biopsy results should be available in 24-48 hours.  Transferred her to [facility] to reschedule the appointment.

## 2017-11-06 NOTE — Telephone Encounter (Signed)
Per Dr Antonieta Pert request  Referral made to [facility]. Spoke with Amy. Requested that she make contact with patient's ex wife Delcie Roch for arrangement of services.

## 2017-11-06 NOTE — Telephone Encounter (Signed)
Called and left a message for [facility] regarding [facility] appointment today and the dermatologist appointment yesterday.  Requested a return call.

## 2017-11-07 ENCOUNTER — Other Ambulatory Visit: Payer: Self-pay | Admitting: *Deleted

## 2017-11-07 MED ORDER — DIAZEPAM 5 MG PO TABS: 5.0000 mg | ORAL_TABLET | Freq: Three times a day (TID) | ORAL | 2 refills | Status: DC | PRN

## 2017-11-08 ENCOUNTER — Ambulatory Visit: Payer: Medicare Other | Admitting: Radiation Oncology

## 2017-11-09 ENCOUNTER — Encounter: Payer: Self-pay | Admitting: Hematology & Oncology

## 2017-11-14 ENCOUNTER — Other Ambulatory Visit: Payer: Self-pay | Admitting: *Deleted

## 2017-11-14 MED ORDER — MORPHINE SULFATE (CONCENTRATE) 20 MG/ML PO SOLN
5.0000 mg | ORAL | 0 refills | Status: DC | PRN
Start: 2017-11-14 — End: 2017-11-20

## 2017-11-16 ENCOUNTER — Encounter: Payer: Self-pay | Admitting: Hematology & Oncology

## 2017-11-20 ENCOUNTER — Other Ambulatory Visit: Payer: Self-pay | Admitting: *Deleted

## 2017-11-20 MED ORDER — MORPHINE SULFATE (CONCENTRATE) 20 MG/ML PO SOLN
5.0000 mg | ORAL | 0 refills | Status: AC | PRN
Start: 2017-11-20 — End: ?

## 2017-11-20 MED ORDER — DIAZEPAM 10 MG PO TABS: 10.0000 mg | ORAL_TABLET | Freq: Three times a day (TID) | ORAL | 2 refills | Status: AC | PRN

## 2017-11-26 ENCOUNTER — Telehealth: Payer: Self-pay | Admitting: *Deleted

## 2017-11-26 NOTE — Telephone Encounter (Signed)
Received notification from [place] that patient passed away at home on 12-22-17 at 12:08am.  Dr Marin Olp notified.

## 2017-11-28 ENCOUNTER — Ambulatory Visit: Payer: Medicare Other | Admitting: Cardiovascular Disease

## 2017-12-07 DEATH — deceased
